# Patient Record
Sex: Female | Born: 1950 | Race: White | Hispanic: No | Marital: Married | Smoking: Former smoker
Health system: Southern US, Community
[De-identification: ages and names within clinical notes are randomized; demographics above are authoritative.]

## PROBLEM LIST (undated history)

## (undated) DIAGNOSIS — R0600 Dyspnea, unspecified: Secondary | ICD-10-CM

## (undated) DIAGNOSIS — E785 Hyperlipidemia, unspecified: Secondary | ICD-10-CM

## (undated) DIAGNOSIS — Z860100 Personal history of colon polyps, unspecified: Secondary | ICD-10-CM

## (undated) DIAGNOSIS — F17200 Nicotine dependence, unspecified, uncomplicated: Secondary | ICD-10-CM

## (undated) DIAGNOSIS — L578 Other skin changes due to chronic exposure to nonionizing radiation: Secondary | ICD-10-CM

## (undated) DIAGNOSIS — I10 Essential (primary) hypertension: Secondary | ICD-10-CM

## (undated) DIAGNOSIS — R011 Cardiac murmur, unspecified: Secondary | ICD-10-CM

## (undated) DIAGNOSIS — R079 Chest pain, unspecified: Principal | ICD-10-CM

## (undated) DIAGNOSIS — Z0000 Encounter for general adult medical examination without abnormal findings: Secondary | ICD-10-CM

## (undated) DIAGNOSIS — E875 Hyperkalemia: Secondary | ICD-10-CM

## (undated) DIAGNOSIS — Z87891 Personal history of nicotine dependence: Secondary | ICD-10-CM

## (undated) DIAGNOSIS — Z8619 Personal history of other infectious and parasitic diseases: Secondary | ICD-10-CM

## (undated) DIAGNOSIS — F4321 Adjustment disorder with depressed mood: Secondary | ICD-10-CM

## (undated) DIAGNOSIS — F640 Transsexualism: Secondary | ICD-10-CM

## (undated) DIAGNOSIS — S61419A Laceration without foreign body of unspecified hand, initial encounter: Secondary | ICD-10-CM

## (undated) DIAGNOSIS — Z8481 Family history of carrier of genetic disease: Secondary | ICD-10-CM

## (undated) HISTORY — DX: Chest pain, unspecified: R07.9

## (undated) HISTORY — DX: Hyperkalemia: E87.5

## (undated) HISTORY — PX: COLONOSCOPY: SHX174

## (undated) HISTORY — DX: Nicotine dependence, unspecified, uncomplicated: F17.200

## (undated) HISTORY — DX: Adjustment disorder with depressed mood: F43.21

## (undated) HISTORY — DX: Dyspnea, unspecified: R06.00

## (undated) HISTORY — DX: Laceration without foreign body of unspecified hand, initial encounter: S61.419A

## (undated) HISTORY — DX: Personal history of other infectious and parasitic diseases: Z86.19

## (undated) HISTORY — DX: Personal history of nicotine dependence: Z87.891

## (undated) HISTORY — DX: Other skin changes due to chronic exposure to nonionizing radiation: L57.8

## (undated) HISTORY — PX: POLYPECTOMY: SHX149

## (undated) HISTORY — DX: Transsexualism: F64.0

## (undated) HISTORY — DX: Personal history of colonic polyps: Z86.010

## (undated) HISTORY — DX: Cardiac murmur, unspecified: R01.1

## (undated) HISTORY — DX: Hyperlipidemia, unspecified: E78.5

## (undated) HISTORY — DX: Family history of carrier of genetic disease: Z84.81

## (undated) HISTORY — DX: Essential (primary) hypertension: I10

## (undated) HISTORY — DX: Personal history of colon polyps, unspecified: Z86.0100

## (undated) HISTORY — DX: Encounter for general adult medical examination without abnormal findings: Z00.00

---
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
## 2011-01-14 ENCOUNTER — Emergency Department (INDEPENDENT_AMBULATORY_CARE_PROVIDER_SITE_OTHER): Payer: 59

## 2011-01-14 ENCOUNTER — Emergency Department (HOSPITAL_BASED_OUTPATIENT_CLINIC_OR_DEPARTMENT_OTHER)
Admission: EM | Admit: 2011-01-14 | Discharge: 2011-01-15 | Disposition: A | Discharge status: Home / Self Care | Payer: 59 | Attending: Emergency Medicine | Admitting: Emergency Medicine

## 2011-01-14 DIAGNOSIS — Z79899 Other long term (current) drug therapy: Secondary | ICD-10-CM | POA: Insufficient documentation

## 2011-01-14 DIAGNOSIS — S060X0A Concussion without loss of consciousness, initial encounter: Secondary | ICD-10-CM | POA: Insufficient documentation

## 2011-01-14 DIAGNOSIS — R51 Headache: Secondary | ICD-10-CM

## 2011-01-14 DIAGNOSIS — E7800 Pure hypercholesterolemia, unspecified: Secondary | ICD-10-CM | POA: Insufficient documentation

## 2011-01-14 DIAGNOSIS — M542 Cervicalgia: Secondary | ICD-10-CM | POA: Insufficient documentation

## 2011-01-14 DIAGNOSIS — S0100XA Unspecified open wound of scalp, initial encounter: Secondary | ICD-10-CM | POA: Insufficient documentation

## 2011-01-14 DIAGNOSIS — W19XXXA Unspecified fall, initial encounter: Secondary | ICD-10-CM

## 2011-01-14 DIAGNOSIS — F17200 Nicotine dependence, unspecified, uncomplicated: Secondary | ICD-10-CM | POA: Insufficient documentation

## 2011-01-14 DIAGNOSIS — Z043 Encounter for examination and observation following other accident: Secondary | ICD-10-CM

## 2011-01-14 DIAGNOSIS — I10 Essential (primary) hypertension: Secondary | ICD-10-CM | POA: Insufficient documentation

## 2011-01-14 DIAGNOSIS — Y92009 Unspecified place in unspecified non-institutional (private) residence as the place of occurrence of the external cause: Secondary | ICD-10-CM | POA: Insufficient documentation

## 2012-01-09 ENCOUNTER — Encounter: Payer: Self-pay | Admitting: Internal Medicine

## 2012-01-09 ENCOUNTER — Ambulatory Visit (INDEPENDENT_AMBULATORY_CARE_PROVIDER_SITE_OTHER): Payer: 59 | Admitting: Internal Medicine

## 2012-01-09 ENCOUNTER — Ambulatory Visit: Payer: Self-pay | Admitting: Internal Medicine

## 2012-01-09 DIAGNOSIS — E785 Hyperlipidemia, unspecified: Secondary | ICD-10-CM

## 2012-01-09 DIAGNOSIS — I10 Essential (primary) hypertension: Secondary | ICD-10-CM

## 2012-01-09 DIAGNOSIS — K635 Polyp of colon: Secondary | ICD-10-CM

## 2012-01-09 DIAGNOSIS — D126 Benign neoplasm of colon, unspecified: Secondary | ICD-10-CM

## 2012-01-09 DIAGNOSIS — Z79899 Other long term (current) drug therapy: Secondary | ICD-10-CM

## 2012-01-09 DIAGNOSIS — E291 Testicular hypofunction: Secondary | ICD-10-CM

## 2012-01-09 LAB — BASIC METABOLIC PANEL
BUN: 17 mg/dL (ref 6–23)
CO2: 27 mEq/L (ref 19–32)
Calcium: 9.9 mg/dL (ref 8.4–10.5)
Glucose, Bld: 97 mg/dL (ref 70–99)

## 2012-01-09 LAB — CBC WITH DIFFERENTIAL/PLATELET
Basophils Relative: 0 % (ref 0–1)
Eosinophils Absolute: 0.2 10*3/uL (ref 0.0–0.7)
HCT: 46.9 % (ref 39.0–52.0)
Hemoglobin: 16.2 g/dL (ref 13.0–17.0)
Lymphs Abs: 2 10*3/uL (ref 0.7–4.0)
MCH: 33.5 pg (ref 26.0–34.0)
MCHC: 34.5 g/dL (ref 30.0–36.0)
MCV: 97.1 fL (ref 78.0–100.0)
Monocytes Absolute: 0.4 10*3/uL (ref 0.1–1.0)
Monocytes Relative: 5 % (ref 3–12)
RBC: 4.83 MIL/uL (ref 4.22–5.81)

## 2012-01-09 LAB — HEPATIC FUNCTION PANEL
ALT: 14 U/L (ref 0–53)
AST: 15 U/L (ref 0–37)
Bilirubin, Direct: 0.1 mg/dL (ref 0.0–0.3)
Indirect Bilirubin: 0.6 mg/dL (ref 0.0–0.9)
Total Bilirubin: 0.7 mg/dL (ref 0.3–1.2)

## 2012-01-09 LAB — LIPID PANEL
Cholesterol: 201 mg/dL — ABNORMAL HIGH (ref 0–200)
VLDL: 20 mg/dL (ref 0–40)

## 2012-01-09 MED ORDER — LISINOPRIL 20 MG PO TABS: 20.0000 mg | ORAL_TABLET | Freq: Every day | ORAL | Status: DC

## 2012-01-09 MED ORDER — VARDENAFIL HCL 10 MG PO TABS: 10.0000 mg | ORAL_TABLET | Freq: Every day | ORAL | Status: DC | PRN

## 2012-01-09 MED ORDER — ROSUVASTATIN CALCIUM 20 MG PO TABS: 20.0000 mg | ORAL_TABLET | Freq: Every day | ORAL | Status: DC

## 2012-01-09 NOTE — Patient Instructions (Signed)
Please schedule cbc, chem7, lipid, lft, tsh, ua with reflex, psa -v70.0 prior to next visit

## 2012-01-10 LAB — TESTOSTERONE: Testosterone: 358.27 ng/dL (ref 250–890)

## 2012-01-15 ENCOUNTER — Telehealth: Payer: Self-pay | Admitting: Internal Medicine

## 2012-01-15 MED ORDER — TADALAFIL 10 MG PO TABS: 10.0000 mg | ORAL_TABLET | Freq: Every day | ORAL | Status: DC | PRN

## 2012-01-15 NOTE — Telephone Encounter (Signed)
Could they switch the levitra to cialis or viagra as they are less expensive

## 2012-01-15 NOTE — Telephone Encounter (Signed)
Attempted to reach pt and received busy tone.

## 2012-01-15 NOTE — Telephone Encounter (Signed)
Can try cialis.  Rx sent.

## 2012-01-15 NOTE — Telephone Encounter (Signed)
Please advise 

## 2012-01-16 NOTE — Telephone Encounter (Signed)
Attempted to reach pt and received fast busy tone.  Encounter is being closed as Rx alternative was sent to pharmacy yesterday.

## 2012-01-18 DIAGNOSIS — K635 Polyp of colon: Secondary | ICD-10-CM | POA: Insufficient documentation

## 2012-01-18 DIAGNOSIS — I10 Essential (primary) hypertension: Secondary | ICD-10-CM | POA: Insufficient documentation

## 2012-01-18 DIAGNOSIS — E291 Testicular hypofunction: Secondary | ICD-10-CM | POA: Insufficient documentation

## 2012-01-18 DIAGNOSIS — E785 Hyperlipidemia, unspecified: Secondary | ICD-10-CM | POA: Insufficient documentation

## 2012-01-18 NOTE — Assessment & Plan Note (Signed)
Obtain testosterone. Attempt levitra.

## 2012-01-18 NOTE — Assessment & Plan Note (Signed)
Rf. Lisinopril. Maintain outpt bp log for review. Obtain cbc and chem7

## 2012-01-18 NOTE — Progress Notes (Signed)
  Subjective:    Patient ID: Taylor Newman, female    DOB: May 27, 1951, 61 y.o.   MRN: [id number]  HPI Pt presents to clinic to establish care. Has posterior neck ST mass c/w lipoma- asx without pain. Notes ED with intact libido. Continues to smoke tobacco but is not ready for cessation. Last colonoscopy 2010 with 3 year recommended follow up. Tolerates statin tx without myalgia or abn lft. No other complaints.  Past Medical History  Diagnosis Date  . History of chicken pox   . Heart murmur   . Hypertension   . Hyperlipidemia   . History of colon polyps    Past Surgical History  Procedure Date  . Tonsillectomy 1954    reports that he has been smoking Cigarettes.  He has never used smokeless tobacco. He reports that he drinks alcohol. He reports that he does not use illicit drugs. family history includes Breast cancer in his sister; Colon cancer in his father; Diabetes in his maternal grandmother; Heart disease in his father; and Hyperlipidemia in his father and mother.  There is no history of Prostate cancer. No Known Allergies   Review of Systems  Respiratory: Negative for shortness of breath.   Cardiovascular: Negative for chest pain.  Gastrointestinal: Negative for abdominal pain.  All other systems reviewed and are negative.       Objective:   Physical Exam  Nursing note and vitals reviewed. Constitutional: He appears well-developed and well-nourished. No distress.  HENT:  Head: Normocephalic and atraumatic.  Right Ear: External ear normal.  Left Ear: External ear normal.  Eyes: Conjunctivae are normal. Right eye exhibits no discharge. Left eye exhibits no discharge. No scleral icterus.  Neck: Neck supple.  Cardiovascular: Normal rate, regular rhythm and normal heart sounds.  Exam reveals no gallop and no friction rub.   No murmur heard. Pulmonary/Chest: Effort normal and breath sounds normal. No respiratory distress. He has no wheezes. He has no rales.  Lymphadenopathy:      He has no cervical adenopathy.  Neurological: He is alert.  Skin: Skin is warm and dry. He is not diaphoretic.  Psychiatric: He has a normal mood and affect.          Assessment & Plan:

## 2012-01-18 NOTE — Assessment & Plan Note (Signed)
Obtain lipid/lft. 

## 2012-01-18 NOTE — Assessment & Plan Note (Signed)
Schedule colonoscopy

## 2012-01-25 ENCOUNTER — Ambulatory Visit (AMBULATORY_SURGERY_CENTER): Payer: 59 | Admitting: *Deleted

## 2012-01-25 ENCOUNTER — Telehealth: Payer: Self-pay | Admitting: *Deleted

## 2012-01-25 VITALS — Ht 69.0 in | Wt 175.0 lb

## 2012-01-25 DIAGNOSIS — Z1211 Encounter for screening for malignant neoplasm of colon: Secondary | ICD-10-CM

## 2012-01-25 DIAGNOSIS — Z86010 Personal history of colonic polyps: Secondary | ICD-10-CM

## 2012-01-25 MED ORDER — PEG-KCL-NACL-NASULF-NA ASC-C 100 G PO SOLR: ORAL | Status: DC

## 2012-01-25 NOTE — Progress Notes (Signed)
Patient states has had colonoscopy in [place] in 2000 normal & 2005 with polyps removed. Last colonoscopy was at [facility] in 2010 with polyps removed and recommendations given to repeat colonoscopy in 3 years. Patient denies any GI problems or complaints. Patient signed release of information form and this was given to [facility]. Phone note sent her also.

## 2012-01-25 NOTE — Telephone Encounter (Signed)
Patient states had colonoscopy in [place] in 2000 normal & 2005 polyps removed. Last colonoscopy was with [facility] with polyps removed and recommended repeat in 3 years. Patient does not know Doctor's name. Release of information completed and signed by patient for [facility].

## 2012-02-11 ENCOUNTER — Encounter: Payer: Self-pay | Admitting: Internal Medicine

## 2012-02-11 ENCOUNTER — Ambulatory Visit (AMBULATORY_SURGERY_CENTER): Payer: 59 | Admitting: Internal Medicine

## 2012-02-11 DIAGNOSIS — Z1211 Encounter for screening for malignant neoplasm of colon: Secondary | ICD-10-CM

## 2012-02-11 DIAGNOSIS — Z860100 Personal history of colon polyps, unspecified: Secondary | ICD-10-CM

## 2012-02-11 DIAGNOSIS — K635 Polyp of colon: Secondary | ICD-10-CM

## 2012-02-11 DIAGNOSIS — D126 Benign neoplasm of colon, unspecified: Secondary | ICD-10-CM

## 2012-02-11 MED ORDER — CIPROFLOXACIN HCL 500 MG PO TABS: 500.0000 mg | ORAL_TABLET | Freq: Two times a day (BID) | ORAL | Status: AC

## 2012-02-11 MED ORDER — METRONIDAZOLE 500 MG PO TABS: 500.0000 mg | ORAL_TABLET | Freq: Three times a day (TID) | ORAL | Status: AC

## 2012-02-11 MED ORDER — SODIUM CHLORIDE 0.9 % IV SOLN: 500.0000 mL | INTRAVENOUS | Status: DC

## 2012-02-11 NOTE — Op Note (Signed)
[facility] [address] [place], [place]  [address]  COLONOSCOPY PROCEDURE REPORT  PATIENT:  Taylor Newman, Taylor Newman  MR#:  [id number] BIRTHDATE:  1951/09/05, 60 yrs. old  GENDER:  female ENDOSCOPIST:  Carie Caddy. Jakaila Norment, MD REF. BY:  Charlynn Court, M.D. PROCEDURE DATE:  02/11/2012 PROCEDURE:  Colon with cold biopsy polypectomy ASA CLASS:  Class II INDICATIONS:  surveillance and high-risk screening (history of adenomatous colon polyp) MEDICATIONS:   These medications were titrated to patient response per physician's verbal order, Versed 9 mg IV, Fentanyl 100 mcg IV  DESCRIPTION OF PROCEDURE:   After the risks benefits and alternatives of the procedure were thoroughly explained, informed consent was obtained.  Digital rectal exam was performed and revealed perianal skin tags.   The LB 180AL [id number] and LB PCF-H180AL [id number] endoscope was introduced through the anus and advanced to the cecum, which was identified by both the appendix and ileocecal valve, without limitations.  The quality of the prep was good, using MoviPrep.  The instrument was then slowly withdrawn as the colon was fully examined. <<PROCEDUREIMAGES>> FINDINGS:  A 4 mm sessile polyp was found in the transverse colon. The polyp was removed using cold biopsy forceps.  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  Moderate diverticulosis was found ascending colon to sigmoid colon.  A small lipoma was found in the transverse colon.   Retroflexed views in the rectum revealed no other findings other than those already described (skin tag).  The scope was then withdrawn from the cecum and the procedure completed.  COMPLICATIONS:  None  ENDOSCOPIC IMPRESSION: 1) Sessile polyp in the transverse colon.  Removed and sent to pathology. 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  RECOMMENDATIONS: 1) Hold aspirin, aspirin products, and anti-inflammatory medication for 1 week. 2) Await pathology results 3) High fiber diet. 4) Given your personal history of adenomatous (pre-cancerous) polyps, you will need a repeat colonoscopy in 5 years. 5) Given ulcerated diverticulum seen on today's examination, would recommend antibiotics for 7 days, ciprofloxacin 500 mg twice daily and metronidazole 500 mg three times daily.  Carie Caddy. Rhea Belton, MD  CC:  Charlynn Court MD The Patient  n. eSIGNEDCarie Caddy. Deontrae Drinkard at 02/11/2012 09:11 AM  [place], [phone]

## 2012-02-11 NOTE — Patient Instructions (Signed)
HOLD ASPIRIN AND ANTIINFLAMMATORY MEDICATIONS FOR ONE WEEK  YOU HAD AN ENDOSCOPIC PROCEDURE TODAY AT THE [facility]: Refer to the procedure report that was given to you for any specific questions about what was found during the examination.  If the procedure report does not answer your questions, please call your gastroenterologist to clarify.  If you requested that your care partner not be given the details of your procedure findings, then the procedure report has been included in a sealed envelope for you to review at your convenience later.  YOU SHOULD EXPECT: Some feelings of bloating in the abdomen. Passage of more gas than usual.  Walking can help get rid of the air that was put into your GI tract during the procedure and reduce the bloating. If you had a lower endoscopy (such as a colonoscopy or flexible sigmoidoscopy) you may notice spotting of blood in your stool or on the toilet paper. If you underwent a bowel prep for your procedure, then you may not have a normal bowel movement for a few days.  DIET: Your first meal following the procedure should be a light meal and then it is ok to progress to your normal diet.  A half-sandwich or bowl of soup is an example of a good first meal.  Heavy or fried foods are harder to digest and may make you feel nauseous or bloated.  Likewise meals heavy in dairy and vegetables can cause extra gas to form and this can also increase the bloating.  Drink plenty of fluids but you should avoid alcoholic beverages for 24 hours.  ACTIVITY: Your care partner should take you home directly after the procedure.  You should plan to take it easy, moving slowly for the rest of the day.  You can resume normal activity the day after the procedure however you should NOT DRIVE or use heavy machinery for 24 hours (because of the sedation medicines used during the test).    SYMPTOMS TO REPORT IMMEDIATELY: A gastroenterologist can be reached at any hour.  During  normal business hours, 8:30 AM to 5:00 PM Monday through Friday, call [phone].  After hours and on weekends, please call the GI answering service at [phone] who will take a message and have the physician on call contact you.   Following lower endoscopy (colonoscopy or flexible sigmoidoscopy):  Excessive amounts of blood in the stool  Significant tenderness or worsening of abdominal pains  Swelling of the abdomen that is new, acute  Fever of 100F or higher  FOLLOW UP: If any biopsies were taken you will be contacted by phone or by letter within the next 1-3 weeks.  Call your gastroenterologist if you have not heard about the biopsies in 3 weeks.  Our staff will call the home number listed on your records the next business day following your procedure to check on you and address any questions or concerns that you may have at that time regarding the information given to you following your procedure. This is a courtesy call and so if there is no answer at the home number and we have not heard from you through the emergency physician on call, we will assume that you have returned to your regular daily activities without incident.  SIGNATURES/CONFIDENTIALITY: You and/or your care partner have signed paperwork which will be entered into your electronic medical record.  These signatures attest to the fact that that the information above on your After Visit Summary has been reviewed and is   understood.  Full responsibility of the confidentiality of this discharge information lies with you and/or your care-partner.  

## 2012-02-11 NOTE — Progress Notes (Signed)
Patient did not experience any of the following events: a burn prior to discharge; a fall within the facility; wrong site/side/patient/procedure/implant event; or a hospital transfer or hospital admission upon discharge from the facility. (G8907) Patient did not have preoperative order for IV antibiotic SSI prophylaxis. (G8918)  

## 2012-02-12 ENCOUNTER — Telehealth: Payer: Self-pay | Admitting: *Deleted

## 2012-02-12 NOTE — Telephone Encounter (Signed)
  Follow up Call-  Call back number 02/11/2012  Post procedure Call Back phone  # [phone]  Permission to leave phone message Yes     Patient questions:  Do you have a fever, pain , or abdominal swelling? no Pain Score  0 *  Have you tolerated food without any problems? yes  Have you been able to return to your normal activities? yes  Do you have any questions about your discharge instructions: Diet   no Medications  no Follow up visit  no  Do you have questions or concerns about your Care? no  Actions: * If pain score is 4 or above: No action needed, pain <4.

## 2012-02-15 ENCOUNTER — Encounter: Payer: Self-pay | Admitting: Internal Medicine

## 2012-07-07 ENCOUNTER — Encounter: Payer: 59 | Admitting: Internal Medicine

## 2012-07-11 ENCOUNTER — Telehealth: Payer: Self-pay | Admitting: *Deleted

## 2012-07-11 DIAGNOSIS — Z0000 Encounter for general adult medical examination without abnormal findings: Secondary | ICD-10-CM

## 2012-07-11 LAB — LIPID PANEL
Cholesterol: 181 mg/dL (ref 0–200)
Triglycerides: 73 mg/dL (ref <150)
VLDL: 15 mg/dL (ref 0–40)

## 2012-07-11 LAB — CBC WITH DIFFERENTIAL/PLATELET
Basophils Absolute: 0 10*3/uL (ref 0.0–0.1)
Basophils Relative: 0 % (ref 0–1)
Eosinophils Absolute: 0.1 10*3/uL (ref 0.0–0.7)
Eosinophils Relative: 1 % (ref 0–5)
HCT: 45.6 % (ref 39.0–52.0)
Hemoglobin: 15.9 g/dL (ref 13.0–17.0)
MCH: 33.2 pg (ref 26.0–34.0)
MCHC: 34.9 g/dL (ref 30.0–36.0)
Monocytes Absolute: 0.3 10*3/uL (ref 0.1–1.0)
Monocytes Relative: 4 % (ref 3–12)
Neutro Abs: 5.7 10*3/uL (ref 1.7–7.7)
RDW: 13.3 % (ref 11.5–15.5)

## 2012-07-11 LAB — BASIC METABOLIC PANEL
CO2: 24 mEq/L (ref 19–32)
Calcium: 9.6 mg/dL (ref 8.4–10.5)
Chloride: 105 mEq/L (ref 96–112)
Glucose, Bld: 108 mg/dL — ABNORMAL HIGH (ref 70–99)
Sodium: 140 mEq/L (ref 135–145)

## 2012-07-11 LAB — HEPATIC FUNCTION PANEL
ALT: 16 U/L (ref 0–53)
AST: 16 U/L (ref 0–37)
Albumin: 4.4 g/dL (ref 3.5–5.2)
Total Protein: 6.2 g/dL (ref 6.0–8.3)

## 2012-07-11 LAB — PSA: PSA: 0.26 ng/mL (ref <=4.00)

## 2012-07-11 NOTE — Telephone Encounter (Signed)
Pt presented to the lab. Orders placed per previous office note:  Please schedule cbc, chem7, lipid, lft, tsh, ua with reflex, psa -v70.0 prior to next visit

## 2012-07-12 LAB — URINALYSIS, ROUTINE W REFLEX MICROSCOPIC
Bilirubin Urine: NEGATIVE
Glucose, UA: NEGATIVE mg/dL
Leukocytes, UA: NEGATIVE
Protein, ur: NEGATIVE mg/dL
Specific Gravity, Urine: 1.005 (ref 1.005–1.030)
pH: 6.5 (ref 5.0–8.0)

## 2012-07-14 ENCOUNTER — Ambulatory Visit (INDEPENDENT_AMBULATORY_CARE_PROVIDER_SITE_OTHER): Payer: 59 | Admitting: Internal Medicine

## 2012-07-14 ENCOUNTER — Encounter: Payer: 59 | Admitting: Internal Medicine

## 2012-07-14 ENCOUNTER — Encounter: Payer: Self-pay | Admitting: Internal Medicine

## 2012-07-14 VITALS — BP 124/84 | HR 59 | Temp 98.3°F | Resp 16 | Ht 69.0 in | Wt 170.0 lb

## 2012-07-14 DIAGNOSIS — R7309 Other abnormal glucose: Secondary | ICD-10-CM

## 2012-07-14 DIAGNOSIS — Z0000 Encounter for general adult medical examination without abnormal findings: Secondary | ICD-10-CM

## 2012-07-14 DIAGNOSIS — R739 Hyperglycemia, unspecified: Secondary | ICD-10-CM

## 2012-07-14 DIAGNOSIS — Z136 Encounter for screening for cardiovascular disorders: Secondary | ICD-10-CM

## 2012-07-14 NOTE — Progress Notes (Signed)
  Subjective:    Patient ID: Taylor Newman, female    DOB: 08-15-1951, 61 y.o.   MRN: [id number]  HPI Pt presents to clinic for annual exam. Reviewed mildly elevated fasting glucose. Continues to smoke tobacco. Aware of need for cessation but feels the timing is not ideal given family stressors.   Past Medical History  Diagnosis Date  . History of chicken pox   . Heart murmur   . Hypertension   . Hyperlipidemia   . History of colon polyps    Past Surgical History  Procedure Date  . Tonsillectomy 1954  . Colonoscopy     reports that he has been smoking Cigarettes.  He has a 45 pack-year smoking history. He has never used smokeless tobacco. He reports that he drinks about 4.8 ounces of alcohol per week. He reports that he does not use illicit drugs. family history includes Breast cancer in his sister; Colon cancer (age of onset:74) in his father; Diabetes in his maternal grandmother; Heart disease in his father; and Hyperlipidemia in his father and mother.  There is no history of Prostate cancer. No Known Allergies   Review of Systems see hpi     Objective:   Physical Exam  Nursing note and vitals reviewed. Constitutional: He appears well-developed. No distress.  HENT:  Head: Normocephalic and atraumatic.  Right Ear: External ear normal.  Left Ear: External ear normal.  Nose: Nose normal.  Mouth/Throat: Oropharynx is clear and moist. No oropharyngeal exudate.  Eyes: Conjunctivae and EOM are normal. Pupils are equal, round, and reactive to light. No scleral icterus.  Neck: Neck supple. Carotid bruit is not present. No thyromegaly present.  Cardiovascular: Normal rate, regular rhythm and normal heart sounds.  Exam reveals no gallop and no friction rub.   No murmur heard. Pulmonary/Chest: Effort normal and breath sounds normal. No respiratory distress. He has no wheezes. He has no rales.  Abdominal: Soft. Bowel sounds are normal. He exhibits no distension and no mass. There is no  tenderness. There is no rebound and no guarding.  Lymphadenopathy:    He has no cervical adenopathy.  Neurological: He is alert.  Skin: Skin is warm and dry. He is not diaphoretic.  Psychiatric: He has a normal mood and affect.          Assessment & Plan:

## 2012-07-14 NOTE — Patient Instructions (Signed)
Please schedule fasting labs prior to next visit Chem7, a1c-hyperglycemia and lipid/lft-272.4 

## 2012-07-14 NOTE — Assessment & Plan Note (Signed)
Nl exam. Labs reviewed. Low sugar/carb diet and exercise recommended. EKG obtained demonstrates SB 50 with PAC. Nl intervals and axis.

## 2012-09-16 ENCOUNTER — Other Ambulatory Visit: Payer: Self-pay | Admitting: Internal Medicine

## 2012-12-25 ENCOUNTER — Encounter: Payer: Self-pay | Admitting: Family Medicine

## 2012-12-25 ENCOUNTER — Ambulatory Visit (INDEPENDENT_AMBULATORY_CARE_PROVIDER_SITE_OTHER): Payer: 59 | Admitting: Family Medicine

## 2012-12-25 VITALS — BP 126/76 | HR 56 | Temp 97.4°F | Ht 66.0 in | Wt 170.2 lb

## 2012-12-25 DIAGNOSIS — H60549 Acute eczematoid otitis externa, unspecified ear: Secondary | ICD-10-CM

## 2012-12-25 DIAGNOSIS — H60399 Other infective otitis externa, unspecified ear: Secondary | ICD-10-CM

## 2012-12-25 DIAGNOSIS — H60509 Unspecified acute noninfective otitis externa, unspecified ear: Secondary | ICD-10-CM

## 2012-12-25 DIAGNOSIS — H6010 Cellulitis of external ear, unspecified ear: Secondary | ICD-10-CM | POA: Insufficient documentation

## 2012-12-25 MED ORDER — FLUOCINOLONE ACETONIDE 0.01 % OT OIL: 5.0000 [drp] | TOPICAL_OIL | Freq: Two times a day (BID) | OTIC | Status: DC

## 2012-12-25 MED ORDER — CEPHALEXIN 500 MG PO CAPS: 500.0000 mg | ORAL_CAPSULE | Freq: Two times a day (BID) | ORAL | Status: DC

## 2012-12-25 MED ORDER — CEPHALEXIN 500 MG PO CAPS: 500.0000 mg | ORAL_CAPSULE | Freq: Two times a day (BID) | ORAL | Status: AC

## 2012-12-25 MED ORDER — NEOMYCIN-POLYMYXIN-HC 3.5-10000-1 OT SUSP: 4.0000 [drp] | Freq: Three times a day (TID) | OTIC | Status: DC

## 2012-12-25 NOTE — Progress Notes (Signed)
  Subjective:    Patient ID: Taylor Newman, female    DOB: 1951-06-08, 62 y.o.   MRN: [id number]  HPI R ear pain- described as a throbbing, 'very tender to touch'.  sxs started early in the week w/ nasal congestion- took benadryl w/out relief.  Ear is now red and swollen- denies warmth.  Is having drainage from ear when lying on L side.  Drainage is clear.  No documented fevers.  Congestion is improving.  No cough.  W/ exception of ear, otherwise feeling well.  Pt has hx of dry skin in ears and will frequently itch.   Review of Systems For ROS see HPI     Objective:   Physical Exam  Vitals reviewed. Constitutional: He appears well-developed and well-nourished. No distress.  HENT:  Right Ear: There is drainage (clear to pearly white debris in EAC), swelling (erythema and edema of pinna) and tenderness (over pinna and w/ manipulation of tragus).  Left Ear: Tympanic membrane normal. No drainage or swelling.  Nose: Nose normal.  Mouth/Throat: Oropharynx is clear and moist. No oropharyngeal exudate.       No TTP over sinuses          Assessment & Plan:

## 2012-12-25 NOTE — Patient Instructions (Signed)
We're going to treat this w/ both drops and pills Start the Keflex twice daily- take w/ food Use the Cortisporin ear drops for 7 days Once the infection is cleared- start the dermotic drops twice daily for 2 weeks and then as needed Call with any questions or concerns Hang in there!!

## 2012-12-29 NOTE — Assessment & Plan Note (Signed)
New.  Pt w/ chronic ear eczema which was likely the nidus for current infxn.  Start tx w/ both oral and topical abx.  Pt also given script for DermOtic to improve eczema sxs once infxn resolves.  Reviewed supportive care and red flags that should prompt return.  Pt expressed understanding and is in agreement w/ plan.

## 2012-12-29 NOTE — Assessment & Plan Note (Signed)
New.  Start keflex.  Reviewed supportive care and red flags that should prompt return.  Pt expressed understanding and is in agreement w/ plan.

## 2013-01-08 ENCOUNTER — Telehealth: Payer: Self-pay | Admitting: *Deleted

## 2013-01-08 DIAGNOSIS — E785 Hyperlipidemia, unspecified: Secondary | ICD-10-CM

## 2013-01-08 DIAGNOSIS — R7309 Other abnormal glucose: Secondary | ICD-10-CM

## 2013-01-08 LAB — LIPID PANEL
LDL Cholesterol: 96 mg/dL (ref 0–99)
Triglycerides: 144 mg/dL (ref <150)
VLDL: 29 mg/dL (ref 0–40)

## 2013-01-08 LAB — BASIC METABOLIC PANEL
BUN: 14 mg/dL (ref 6–23)
CO2: 27 mEq/L (ref 19–32)
Glucose, Bld: 76 mg/dL (ref 70–99)
Potassium: 4.4 mEq/L (ref 3.5–5.3)

## 2013-01-08 LAB — HEPATIC FUNCTION PANEL
ALT: 12 U/L (ref 0–53)
AST: 15 U/L (ref 0–37)
Bilirubin, Direct: 0.1 mg/dL (ref 0.0–0.3)
Indirect Bilirubin: 0.3 mg/dL (ref 0.0–0.9)
Total Protein: 6.4 g/dL (ref 6.0–8.3)

## 2013-01-08 LAB — HEMOGLOBIN A1C: Hgb A1c MFr Bld: 5.6 % (ref <5.7)

## 2013-01-08 NOTE — Telephone Encounter (Signed)
Pt presented to the lab. Orders given per 07/14/12 office as below:  Please schedule fasting labs prior to next visit  Chem7, a1c-hyperglycemia and lipid/lft-272.4

## 2013-01-12 ENCOUNTER — Ambulatory Visit (INDEPENDENT_AMBULATORY_CARE_PROVIDER_SITE_OTHER): Payer: 59 | Admitting: Family Medicine

## 2013-01-12 ENCOUNTER — Encounter: Payer: Self-pay | Admitting: Family Medicine

## 2013-01-12 VITALS — BP 134/72 | HR 60 | Temp 98.2°F | Ht 69.0 in | Wt 170.4 lb

## 2013-01-12 DIAGNOSIS — Z0000 Encounter for general adult medical examination without abnormal findings: Secondary | ICD-10-CM

## 2013-01-12 DIAGNOSIS — R739 Hyperglycemia, unspecified: Secondary | ICD-10-CM

## 2013-01-12 DIAGNOSIS — R7309 Other abnormal glucose: Secondary | ICD-10-CM

## 2013-01-12 DIAGNOSIS — I10 Essential (primary) hypertension: Secondary | ICD-10-CM

## 2013-01-12 DIAGNOSIS — R079 Chest pain, unspecified: Secondary | ICD-10-CM

## 2013-01-12 DIAGNOSIS — Z125 Encounter for screening for malignant neoplasm of prostate: Secondary | ICD-10-CM

## 2013-01-12 DIAGNOSIS — E785 Hyperlipidemia, unspecified: Secondary | ICD-10-CM

## 2013-01-12 DIAGNOSIS — Z79899 Other long term (current) drug therapy: Secondary | ICD-10-CM

## 2013-01-12 HISTORY — DX: Chest pain, unspecified: R07.9

## 2013-01-12 NOTE — Assessment & Plan Note (Signed)
hgba1c 5.6, minimize simple carbs. 

## 2013-01-12 NOTE — Assessment & Plan Note (Addendum)
Well controlled today no changes 

## 2013-01-12 NOTE — Assessment & Plan Note (Signed)
Mild on previous labs resolved with recent lab work. Encouraged avoid trans fats and heart healthy diet.

## 2013-01-12 NOTE — Assessment & Plan Note (Signed)
Atypical and so far work up is negative for a GI or CV cause. Started on Ranitidine 300 mg qhs, check a 2d echo. Return if symptoms worsen or seek immediate care if symptoms worsen and do not resolve

## 2013-01-12 NOTE — Patient Instructions (Addendum)
Next appt in 6-7 months for annual, labs prior lipid, renal, cbc, tsh, psa, hepatic prior

## 2013-01-12 NOTE — Progress Notes (Signed)
Patient ID: Devaney Segers, female   DOB: 1951-08-17, 62 y.o.   MRN: [id number] Cendy Oconnor [id number] 1950-12-11 01/12/2013      Progress Note-Follow Up  Subjective  Chief Complaint  Chief Complaint  Patient presents with  . Follow-up    6 month    HPI  Patient is a 62 year old female in today complaining of persistent chest pain. He has chest pain. He constantly. It is more of a pressure and throughout his chest. It can be worse when he lies down. Denies any sour taste in the throat or sore throat but does have some occasional sense of dyspepsia. No shortness of breath, diaphoresis or nausea associated. No palpitations. So far cardiovascular and gastrointestinal workup has been unremarkable. No other acute complaints. Does note an occasional cough  Past Medical History  Diagnosis Date  . History of chicken pox   . Heart murmur   . Hypertension   . Hyperlipidemia   . History of colon polyps   . Chest pain 01/12/2013    Past Surgical History  Procedure Date  . Tonsillectomy 1954  . Colonoscopy     Family History  Problem Relation Age of Onset  . Colon cancer Father 103  . Hyperlipidemia Father   . Heart disease Father     stent placement  . Breast cancer Sister   . Hyperlipidemia Mother   . Diabetes Maternal Grandmother     father  . Prostate cancer Neg Hx     History   Social History  . Marital Status: Married    Spouse Name: N/A    Number of Children: N/A  . Years of Education: N/A   Occupational History  . Not on file.   Social History Main Topics  . Smoking status: Current Every Day Smoker -- 1.0 packs/day for 45 years    Types: Cigarettes  . Smokeless tobacco: Never Used     Comment: 1 ppd  . Alcohol Use: 4.8 oz/week    8 Cans of beer per week  . Drug Use: No  . Sexually Active: Not on file   Other Topics Concern  . Not on file   Social History Narrative  . No narrative on file    Current Outpatient Prescriptions on File Prior to Visit   Medication Sig Dispense Refill  . aspirin 81 MG chewable tablet Chew 81 mg by mouth daily.      . CRESTOR 20 MG tablet TAKE 1 TABLET BY MOUTH ONCE DAILY  30 tablet  6  . lisinopril (PRINIVIL,ZESTRIL) 20 MG tablet TAKE 1 TABLET BY MOUTH ONCE DAILY  30 tablet  6  . neomycin-polymyxin-hydrocortisone (CORTISPORIN) 3.5-10000-1 otic suspension Place 4 drops into the right ear 3 (three) times daily.  10 mL  0  . tadalafil (CIALIS) 10 MG tablet Take 1 tablet (10 mg total) by mouth daily as needed for erectile dysfunction.  10 tablet  0    No Known Allergies  Review of Systems  Review of Systems  Constitutional: Negative for fever and malaise/fatigue.  HENT: Negative for congestion.   Eyes: Negative for discharge.  Respiratory: Negative for shortness of breath.   Cardiovascular: Positive for chest pain. Negative for palpitations and leg swelling.  Gastrointestinal: Positive for heartburn. Negative for nausea, abdominal pain and diarrhea.  Genitourinary: Negative for dysuria.  Musculoskeletal: Negative for falls.  Skin: Negative for rash.  Neurological: Negative for loss of consciousness and headaches.  Endo/Heme/Allergies: Negative for polydipsia.  Psychiatric/Behavioral: Negative for depression and suicidal ideas.  The patient is not nervous/anxious and does not have insomnia.     Objective  BP 134/72  Pulse 60  Temp 98.2 F (36.8 C) (Oral)  Ht 5\' 9"  (1.753 m)  Wt 170 lb 6.4 oz (77.293 kg)  BMI 25.16 kg/m2  SpO2 98%  Physical Exam  Physical Exam  Constitutional: He is oriented to person, place, and time and well-developed, well-nourished, and in no distress. No distress.  HENT:  Head: Normocephalic and atraumatic.  Eyes: Conjunctivae normal are normal.  Neck: Neck supple. No thyromegaly present.  Cardiovascular: Normal rate and regular rhythm.  Exam reveals no gallop.   No murmur heard. Pulmonary/Chest: Effort normal and breath sounds normal. No respiratory distress.   Abdominal: He exhibits no distension and no mass. There is no tenderness.  Musculoskeletal: He exhibits no edema.  Neurological: He is alert and oriented to person, place, and time.  Skin: Skin is warm.  Psychiatric: Memory, affect and judgment normal.    Lab Results  Component Value Date   TSH 1.330 07/11/2012   Lab Results  Component Value Date   WBC 7.7 07/11/2012   HGB 15.9 07/11/2012   HCT 45.6 07/11/2012   MCV 95.2 07/11/2012   PLT 255 07/11/2012   Lab Results  Component Value Date   CREATININE 0.90 01/08/2013   BUN 14 01/08/2013   NA 141 01/08/2013   K 4.4 01/08/2013   CL 107 01/08/2013   CO2 27 01/08/2013   Lab Results  Component Value Date   ALT 12 01/08/2013   AST 15 01/08/2013   ALKPHOS 64 01/08/2013   BILITOT 0.4 01/08/2013   Lab Results  Component Value Date   CHOL 176 01/08/2013   Lab Results  Component Value Date   HDL 51 01/08/2013   Lab Results  Component Value Date   LDLCALC 96 01/08/2013   Lab Results  Component Value Date   TRIG 144 01/08/2013   Lab Results  Component Value Date   CHOLHDL 3.5 01/08/2013     Assessment & Plan  Chest pain Atypical and so far work up is negative for a GI or CV cause. Started on Ranitidine 300 mg qhs, check a 2d echo. Return if symptoms worsen or seek immediate care if symptoms worsen and do not resolve  HTN (hypertension) Mild elevation today will continue to monitor, continue current meds for now.  Hyperglycemia hgba1c 5.6, minimize simple carbs.  Other and unspecified hyperlipidemia Mild on previous labs resolved with recent lab work. Encouraged avoid trans fats and heart healthy diet.

## 2013-01-13 NOTE — Progress Notes (Signed)
Patient ID: Taylor Newman, female   DOB: [birth year], 62 y.o.   MRN: [id number] Note above entered in error, note below reflects visit  Taylor Newman [id number] September 10, 1951 01/13/2013      Progress Note-Follow Up  Subjective  Chief Complaint  Chief Complaint  Patient presents with  . Follow-up    6 month    HPI  Patient is a 62 year old female in today for followup. Overall he is feeling well. No recent illness or fevers. Has some occasional dyspepsia. Blood pressures been well-controlled. Denies any headaches, palpitations, chest pain, shortness of breath, GU concerns since last visit.  Past Medical History  Diagnosis Date  . History of chicken pox   . Heart murmur   . Hypertension   . Hyperlipidemia   . History of colon polyps   . Chest pain 01/12/2013    Past Surgical History  Procedure Date  . Tonsillectomy 1954  . Colonoscopy     Family History  Problem Relation Age of Onset  . Colon cancer Father 52  . Hyperlipidemia Father   . Heart disease Father     stent placement  . Breast cancer Sister   . Hyperlipidemia Mother   . Diabetes Maternal Grandmother     father  . Prostate cancer Neg Hx     History   Social History  . Marital Status: Married    Spouse Name: N/A    Number of Children: N/A  . Years of Education: N/A   Occupational History  . Not on file.   Social History Main Topics  . Smoking status: Current Every Day Smoker -- 1.0 packs/day for 45 years    Types: Cigarettes  . Smokeless tobacco: Never Used     Comment: 1 ppd  . Alcohol Use: 4.8 oz/week    8 Cans of beer per week  . Drug Use: No  . Sexually Active: Not on file   Other Topics Concern  . Not on file   Social History Narrative  . No narrative on file    Current Outpatient Prescriptions on File Prior to Visit  Medication Sig Dispense Refill  . aspirin 81 MG chewable tablet Chew 81 mg by mouth daily.      . CRESTOR 20 MG tablet TAKE 1 TABLET BY MOUTH ONCE DAILY  30 tablet  6  .  lisinopril (PRINIVIL,ZESTRIL) 20 MG tablet TAKE 1 TABLET BY MOUTH ONCE DAILY  30 tablet  6  . neomycin-polymyxin-hydrocortisone (CORTISPORIN) 3.5-10000-1 otic suspension Place 4 drops into the right ear 3 (three) times daily.  10 mL  0  . tadalafil (CIALIS) 10 MG tablet Take 1 tablet (10 mg total) by mouth daily as needed for erectile dysfunction.  10 tablet  0    No Known Allergies  Review of Systems  Review of Systems  Constitutional: Negative for fever and malaise/fatigue.  HENT: Negative for congestion.   Eyes: Negative for discharge.  Respiratory: Negative for shortness of breath.   Cardiovascular: Negative for chest pain, palpitations and leg swelling.  Gastrointestinal: Negative for nausea, abdominal pain and diarrhea.  Genitourinary: Negative for dysuria.  Musculoskeletal: Negative for falls.  Skin: Negative for rash.  Neurological: Negative for loss of consciousness and headaches.  Endo/Heme/Allergies: Negative for polydipsia.  Psychiatric/Behavioral: Negative for depression and suicidal ideas. The patient is not nervous/anxious and does not have insomnia.     Objective  BP 134/72  Pulse 60  Temp 98.2 F (36.8 C) (Oral)  Ht 5\' 9"  (1.753 m)  Wt  170 lb 6.4 oz (77.293 kg)  BMI 25.16 kg/m2  SpO2 98%  Physical Exam  Physical Exam  Lab Results  Component Value Date   TSH 1.330 07/11/2012   Lab Results  Component Value Date   WBC 7.7 07/11/2012   HGB 15.9 07/11/2012   HCT 45.6 07/11/2012   MCV 95.2 07/11/2012   PLT 255 07/11/2012   Lab Results  Component Value Date   CREATININE 0.90 01/08/2013   BUN 14 01/08/2013   NA 141 01/08/2013   K 4.4 01/08/2013   CL 107 01/08/2013   CO2 27 01/08/2013   Lab Results  Component Value Date   ALT 12 01/08/2013   AST 15 01/08/2013   ALKPHOS 64 01/08/2013   BILITOT 0.4 01/08/2013   Lab Results  Component Value Date   CHOL 176 01/08/2013   Lab Results  Component Value Date   HDL 51 01/08/2013   Lab Results  Component Value Date    LDLCALC 96 01/08/2013   Lab Results  Component Value Date   TRIG 144 01/08/2013   Lab Results  Component Value Date   CHOLHDL 3.5 01/08/2013     Assessment & Plan  Chest pain Atypical and so far work up is negative for a GI or CV cause. Started on Ranitidine 300 mg qhs, check a 2d echo. Return if symptoms worsen or seek immediate care if symptoms worsen and do not resolve  HTN (hypertension) Well controlled today no changes  Hyperglycemia hgba1c 5.6, minimize simple carbs.  Other and unspecified hyperlipidemia Mild on previous labs resolved with recent lab work. Encouraged avoid trans fats and heart healthy diet.

## 2013-01-13 NOTE — Progress Notes (Signed)
Patient ID: Taylor Newman, female   DOB: 14-Sep-1951, 62 y.o.   MRN: [id number] Above note done in error in wrong chart  Note below reflects patient visit

## 2013-01-24 ENCOUNTER — Other Ambulatory Visit: Payer: Self-pay

## 2013-02-09 ENCOUNTER — Telehealth: Payer: Self-pay

## 2013-02-09 NOTE — Telephone Encounter (Signed)
What strength of Atorvastatin?

## 2013-02-09 NOTE — Telephone Encounter (Signed)
We received a fax from [facility] stating that Crestor is changing to $25 a month.   The following medications will be $0:  Simvastatin, atorvatatin, pravastatin and lovastatin. Please advise change?

## 2013-02-09 NOTE — Telephone Encounter (Signed)
Start with Atorvastatin 20 mg po qhs, disp #30 3 rf.

## 2013-02-09 NOTE — Telephone Encounter (Signed)
So check with patient and see if he wants to stick with Crestor and pay copay or try switching to Atorvastatin, we would want to recheck his cholesterol in 3-4 months if we switch

## 2013-02-09 NOTE — Telephone Encounter (Signed)
Pt informed and new RX sent

## 2013-02-10 MED ORDER — ATORVASTATIN CALCIUM 20 MG PO TABS: 20.0000 mg | ORAL_TABLET | Freq: Every day | ORAL | Status: DC

## 2013-06-16 ENCOUNTER — Other Ambulatory Visit: Payer: Self-pay | Admitting: Internal Medicine

## 2013-06-17 NOTE — Telephone Encounter (Signed)
Rx request to pharmacy/[facility]  

## 2013-07-06 LAB — PSA: PSA: 0.38 ng/mL (ref <=4.00)

## 2013-07-06 LAB — BASIC METABOLIC PANEL
CO2: 25 mEq/L (ref 19–32)
Calcium: 9.6 mg/dL (ref 8.4–10.5)
Chloride: 105 mEq/L (ref 96–112)

## 2013-07-06 LAB — LIPID PANEL
HDL: 57 mg/dL (ref >39)
LDL Cholesterol: 123 mg/dL — ABNORMAL HIGH (ref 0–99)
Triglycerides: 103 mg/dL (ref <150)
VLDL: 21 mg/dL (ref 0–40)

## 2013-07-06 LAB — URINALYSIS, ROUTINE W REFLEX MICROSCOPIC
Leukocytes, UA: NEGATIVE
Nitrite: NEGATIVE
Specific Gravity, Urine: 1.011 (ref 1.005–1.030)
Urobilinogen, UA: 0.2 mg/dL (ref 0.0–1.0)

## 2013-07-06 LAB — CBC WITH DIFFERENTIAL/PLATELET
Eosinophils Relative: 1 % (ref 0–5)
HCT: 44.3 % (ref 39.0–52.0)
Hemoglobin: 16.1 g/dL (ref 13.0–17.0)
Lymphocytes Relative: 17 % (ref 12–46)
MCHC: 36.3 g/dL — ABNORMAL HIGH (ref 30.0–36.0)
MCV: 94.1 fL (ref 78.0–100.0)
Monocytes Absolute: 0.5 10*3/uL (ref 0.1–1.0)
Monocytes Relative: 6 % (ref 3–12)
Neutro Abs: 6.1 10*3/uL (ref 1.7–7.7)
WBC: 8 10*3/uL (ref 4.0–10.5)

## 2013-07-06 LAB — URINALYSIS, MICROSCOPIC ONLY

## 2013-07-06 LAB — TSH: TSH: 1.565 u[IU]/mL (ref 0.350–4.500)

## 2013-07-06 LAB — HEPATIC FUNCTION PANEL
Albumin: 4.3 g/dL (ref 3.5–5.2)
Total Protein: 6.4 g/dL (ref 6.0–8.3)

## 2013-07-06 NOTE — Addendum Note (Signed)
Addended by: Regis Bill on: 07/06/2013 10:57 AM   Modules accepted: Orders

## 2013-07-13 ENCOUNTER — Encounter: Payer: Self-pay | Admitting: Family Medicine

## 2013-07-13 ENCOUNTER — Ambulatory Visit (INDEPENDENT_AMBULATORY_CARE_PROVIDER_SITE_OTHER): Payer: 59 | Admitting: Family Medicine

## 2013-07-13 VITALS — BP 128/90 | HR 72 | Temp 98.3°F | Ht 69.0 in | Wt 163.1 lb

## 2013-07-13 DIAGNOSIS — F4321 Adjustment disorder with depressed mood: Secondary | ICD-10-CM

## 2013-07-13 DIAGNOSIS — E785 Hyperlipidemia, unspecified: Secondary | ICD-10-CM

## 2013-07-13 DIAGNOSIS — R7309 Other abnormal glucose: Secondary | ICD-10-CM

## 2013-07-13 DIAGNOSIS — R739 Hyperglycemia, unspecified: Secondary | ICD-10-CM

## 2013-07-13 DIAGNOSIS — I10 Essential (primary) hypertension: Secondary | ICD-10-CM

## 2013-07-13 NOTE — Patient Instructions (Addendum)
Next visit annual with labs prior lipid, renal, tsh, hepatic, cbc  DASH Diet The DASH diet stands for "Dietary Approaches to Stop Hypertension." It is a healthy eating plan that has been shown to reduce high blood pressure (hypertension) in as little as 14 days, while also possibly providing other significant health benefits. These other health benefits include reducing the risk of breast cancer after menopause and reducing the risk of type 2 diabetes, heart disease, colon cancer, and stroke. Health benefits also include weight loss and slowing kidney failure in patients with chronic kidney disease.  DIET GUIDELINES  Limit salt (sodium). Your diet should contain less than 1500 mg of sodium daily.  Limit refined or processed carbohydrates. Your diet should include mostly whole grains. Desserts and added sugars should be used sparingly.  Include small amounts of heart-healthy fats. These types of fats include nuts, oils, and tub margarine. Limit saturated and trans fats. These fats have been shown to be harmful in the body. CHOOSING FOODS  The following food groups are based on a 2000 calorie diet. See your Registered Dietitian for individual calorie needs. Grains and Grain Products (6 to 8 servings daily)  Eat More Often: Whole-wheat bread, brown rice, whole-grain or wheat pasta, quinoa, popcorn without added fat or salt (air popped).  Eat Less Often: White bread, white pasta, white rice, cornbread. Vegetables (4 to 5 servings daily)  Eat More Often: Fresh, frozen, and canned vegetables. Vegetables may be raw, steamed, roasted, or grilled with a minimal amount of fat.  Eat Less Often/Avoid: Creamed or fried vegetables. Vegetables in a cheese sauce. Fruit (4 to 5 servings daily)  Eat More Often: All fresh, canned (in natural juice), or frozen fruits. Dried fruits without added sugar. One hundred percent fruit juice ( cup [237 mL] daily).  Eat Less Often: Dried fruits with added sugar.  Canned fruit in light or heavy syrup. [employer], Fish, and Poultry (2 servings or less daily. One serving is 3 to 4 oz [85-114 g]).  Eat More Often: Ninety percent or leaner ground beef, tenderloin, sirloin. Round cuts of beef, chicken breast, [place] breast. All fish. Grill, bake, or broil your meat. Nothing should be fried.  Eat Less Often/Avoid: Fatty cuts of meat, [place], or chicken leg, thigh, or wing. Fried cuts of meat or fish. Dairy (2 to 3 servings)  Eat More Often: Low-fat or fat-free milk, low-fat plain or light yogurt, reduced-fat or part-skim cheese.  Eat Less Often/Avoid: Milk (whole, 2%).Whole milk yogurt. Full-fat cheeses. Nuts, Seeds, and Legumes (4 to 5 servings per week)  Eat More Often: All without added salt.  Eat Less Often/Avoid: Salted nuts and seeds, canned beans with added salt. Fats and Sweets (limited)  Eat More Often: Vegetable oils, tub margarines without trans fats, sugar-free gelatin. Mayonnaise and salad dressings.  Eat Less Often/Avoid: Coconut oils, palm oils, butter, stick margarine, cream, half and half, cookies, candy, pie. FOR MORE INFORMATION The Dash Diet Eating Plan: www.dashdiet.org Document Released: 11/15/2011 Document Revised: 02/18/2012 Document Reviewed: 11/15/2011 [facility] Patient Information 2014 [place], [place].

## 2013-07-15 ENCOUNTER — Encounter: Payer: Self-pay | Admitting: Family Medicine

## 2013-07-15 DIAGNOSIS — F4320 Adjustment disorder, unspecified: Secondary | ICD-10-CM

## 2013-07-15 DIAGNOSIS — F4321 Adjustment disorder with depressed mood: Secondary | ICD-10-CM

## 2013-07-15 HISTORY — DX: Adjustment disorder with depressed mood: F43.21

## 2013-07-15 HISTORY — DX: Adjustment disorder, unspecified: F43.20

## 2013-07-15 NOTE — Assessment & Plan Note (Signed)
Tolerating Lipitor, very mild increase in cholesterol, reminded to avoid trans fats, increase exercise start a krill oil cap

## 2013-07-15 NOTE — Assessment & Plan Note (Signed)
64 year old son died of colon cancer this past spring, he is managing well but still struggles with his grief. He will notify us if he needs any further assistance

## 2013-07-15 NOTE — Assessment & Plan Note (Signed)
Well controlled, no changes 

## 2013-07-15 NOTE — Progress Notes (Signed)
Patient ID: Taylor Newman, female   DOB: [birth year], 62 y.o.   MRN: [id number] Taylor Newman [id number] 1951-03-29 07/15/2013      Progress Note-Follow Up  Subjective  Chief Complaint  Chief Complaint  Patient presents with  . Follow-up    6 months    HPI  Patient is a 62 year old Caucasian female who is in today in followup. He is taking his medications as prescribed and physically has felt well. No recent illness. No chest pain, palpitations, shortness of breath, GI or GU concerns. He did suffer the loss of his 64 year old; cancer this past spring. Shortly after this occurred his other son came as transgender and air now helping him to the process of gender trans-formation  Past Medical History  Diagnosis Date  . History of chicken pox   . Heart murmur   . Hypertension   . Hyperlipidemia   . History of colon polyps   . Chest pain 01/12/2013  . Grief reaction 07/15/2013    Past Surgical History  Procedure Laterality Date  . Tonsillectomy  1954  . Colonoscopy      Family History  Problem Relation Age of Onset  . Colon cancer Father 34  . Hyperlipidemia Father   . Heart disease Father     stent placement  . Hypertension Father   . Breast cancer Sister   . Hyperlipidemia Mother   . Diabetes Maternal Grandmother     father  . Prostate cancer Neg Hx   . Cancer Son 61  . Colon cancer Son     History   Social History  . Marital Status: Married    Spouse Name: N/A    Number of Children: N/A  . Years of Education: N/A   Occupational History  . Not on file.   Social History Main Topics  . Smoking status: Current Every Day Smoker -- 1.00 packs/day for 45 years    Types: Cigarettes  . Smokeless tobacco: Never Used     Comment: 1 ppd  . Alcohol Use: 4.8 oz/week    8 Cans of beer per week  . Drug Use: No  . Sexually Active: Not on file   Other Topics Concern  . Not on file   Social History Narrative  . No narrative on file    Current Outpatient Prescriptions  on File Prior to Visit  Medication Sig Dispense Refill  . aspirin 81 MG chewable tablet Chew 81 mg by mouth daily.      Marland Kitchen atorvastatin (LIPITOR) 20 MG tablet Take 1 tablet (20 mg total) by mouth daily.  30 tablet  3  . lisinopril (PRINIVIL,ZESTRIL) 20 MG tablet TAKE 1 TABLET BY MOUTH ONCE DAILY  30 tablet  3  . neomycin-polymyxin-hydrocortisone (CORTISPORIN) 3.5-10000-1 otic suspension Place 4 drops into the right ear 3 (three) times daily.  10 mL  0  . tadalafil (CIALIS) 10 MG tablet Take 1 tablet (10 mg total) by mouth daily as needed for erectile dysfunction.  10 tablet  0   No current facility-administered medications on file prior to visit.    No Known Allergies  Review of Systems  Review of Systems  Constitutional: Negative for fever and malaise/fatigue.  HENT: Negative for congestion.   Eyes: Negative for pain and discharge.  Respiratory: Negative for shortness of breath.   Cardiovascular: Negative for chest pain, palpitations and leg swelling.  Gastrointestinal: Negative for nausea, abdominal pain and diarrhea.  Genitourinary: Negative for dysuria.  Musculoskeletal: Negative for falls.  Skin: Negative  for rash.  Neurological: Negative for loss of consciousness and headaches.  Endo/Heme/Allergies: Negative for polydipsia.  Psychiatric/Behavioral: Negative for depression and suicidal ideas. The patient is not nervous/anxious and does not have insomnia.     Objective  BP 128/90  Pulse 72  Temp(Src) 98.3 F (36.8 C) (Oral)  Ht 5\' 9"  (1.753 m)  Wt 163 lb 1.9 oz (73.991 kg)  BMI 24.08 kg/m2  SpO2 97%  Physical Exam  Physical Exam  Constitutional: He is oriented to person, place, and time and well-developed, well-nourished, and in no distress. No distress.  HENT:  Head: Normocephalic and atraumatic.  Eyes: Conjunctivae are normal.  Neck: Neck supple. No thyromegaly present.  Cardiovascular: Normal rate, regular rhythm and normal heart sounds.   No murmur  heard. Pulmonary/Chest: Effort normal and breath sounds normal. No respiratory distress.  Abdominal: He exhibits no distension and no mass. There is no tenderness.  Musculoskeletal: He exhibits no edema.  Neurological: He is alert and oriented to person, place, and time.  Skin: Skin is warm.  Psychiatric: Memory, affect and judgment normal.    Lab Results  Component Value Date   TSH 1.565 07/06/2013   Lab Results  Component Value Date   WBC 8.0 07/06/2013   HGB 16.1 07/06/2013   HCT 44.3 07/06/2013   MCV 94.1 07/06/2013   PLT 261 07/06/2013   Lab Results  Component Value Date   CREATININE 0.99 07/06/2013   BUN 18 07/06/2013   NA 138 07/06/2013   K 4.7 07/06/2013   CL 105 07/06/2013   CO2 25 07/06/2013   Lab Results  Component Value Date   ALT 14 07/06/2013   AST 18 07/06/2013   ALKPHOS 67 07/06/2013   BILITOT 0.9 07/06/2013   Lab Results  Component Value Date   CHOL 201* 07/06/2013   Lab Results  Component Value Date   HDL 57 07/06/2013   Lab Results  Component Value Date   LDLCALC 123* 07/06/2013   Lab Results  Component Value Date   TRIG 103 07/06/2013   Lab Results  Component Value Date   CHOLHDL 3.5 07/06/2013     Assessment & Plan  HTN (hypertension) Well controlled, no changes  Other and unspecified hyperlipidemia Tolerating Lipitor, very mild increase in cholesterol, reminded to avoid trans fats, increase exercise start a krill oil cap  Hyperglycemia Sugar up to 127 fasting, encouraged to minimize simple carbs and increase exercise. Check hgba1c  Grief reaction 25 year old son died of colon cancer this past spring, he is managing well but still struggles with his grief. He will notify us if he needs any further assistance

## 2013-07-15 NOTE — Assessment & Plan Note (Signed)
Sugar up to 127 fasting, encouraged to minimize simple carbs and increase exercise. Check hgba1c

## 2013-07-28 ENCOUNTER — Other Ambulatory Visit: Payer: Self-pay | Admitting: Family Medicine

## 2013-10-15 ENCOUNTER — Other Ambulatory Visit: Payer: Self-pay

## 2013-11-26 ENCOUNTER — Other Ambulatory Visit: Payer: Self-pay | Admitting: Family Medicine

## 2013-12-18 ENCOUNTER — Other Ambulatory Visit: Payer: Self-pay | Admitting: Family Medicine

## 2014-01-08 ENCOUNTER — Telehealth: Payer: Self-pay

## 2014-01-08 DIAGNOSIS — R739 Hyperglycemia, unspecified: Secondary | ICD-10-CM

## 2014-01-08 DIAGNOSIS — E785 Hyperlipidemia, unspecified: Secondary | ICD-10-CM

## 2014-01-08 DIAGNOSIS — I10 Essential (primary) hypertension: Secondary | ICD-10-CM

## 2014-01-08 LAB — HEPATIC FUNCTION PANEL
ALBUMIN: 4.5 g/dL (ref 3.5–5.2)
ALT: 18 U/L (ref 0–53)
AST: 16 U/L (ref 0–37)
Alkaline Phosphatase: 66 U/L (ref 39–117)
BILIRUBIN TOTAL: 1 mg/dL (ref 0.2–1.2)
Bilirubin, Direct: 0.2 mg/dL (ref 0.0–0.3)
Indirect Bilirubin: 0.8 mg/dL (ref 0.2–1.2)
Total Protein: 6.9 g/dL (ref 6.0–8.3)

## 2014-01-08 LAB — CBC
HEMATOCRIT: 44.8 % (ref 39.0–52.0)
HEMOGLOBIN: 16 g/dL (ref 13.0–17.0)
MCH: 33.1 pg (ref 26.0–34.0)
MCHC: 35.7 g/dL (ref 30.0–36.0)
MCV: 92.8 fL (ref 78.0–100.0)
Platelets: 278 10*3/uL (ref 150–400)
RBC: 4.83 MIL/uL (ref 4.22–5.81)
RDW: 13.1 % (ref 11.5–15.5)
WBC: 9.1 10*3/uL (ref 4.0–10.5)

## 2014-01-08 LAB — RENAL FUNCTION PANEL
Albumin: 4.5 g/dL (ref 3.5–5.2)
BUN: 13 mg/dL (ref 6–23)
CALCIUM: 9.4 mg/dL (ref 8.4–10.5)
CO2: 28 mEq/L (ref 19–32)
CREATININE: 0.85 mg/dL (ref 0.50–1.35)
Chloride: 99 mEq/L (ref 96–112)
Glucose, Bld: 105 mg/dL — ABNORMAL HIGH (ref 70–99)
POTASSIUM: 4.6 meq/L (ref 3.5–5.3)
Phosphorus: 3 mg/dL (ref 2.3–4.6)
Sodium: 135 mEq/L (ref 135–145)

## 2014-01-08 LAB — LIPID PANEL
Cholesterol: 219 mg/dL — ABNORMAL HIGH (ref 0–200)
HDL: 58 mg/dL (ref >39)
LDL Cholesterol: 128 mg/dL — ABNORMAL HIGH (ref 0–99)
Total CHOL/HDL Ratio: 3.8 Ratio
Triglycerides: 166 mg/dL — ABNORMAL HIGH (ref <150)
VLDL: 33 mg/dL (ref 0–40)

## 2014-01-08 LAB — TSH: TSH: 1.986 u[IU]/mL (ref 0.350–4.500)

## 2014-01-08 NOTE — Telephone Encounter (Signed)
Lab order placed.

## 2014-01-12 ENCOUNTER — Encounter: Payer: Self-pay | Admitting: Family Medicine

## 2014-01-12 ENCOUNTER — Ambulatory Visit: Payer: 59 | Admitting: Family Medicine

## 2014-01-12 ENCOUNTER — Ambulatory Visit (INDEPENDENT_AMBULATORY_CARE_PROVIDER_SITE_OTHER): Payer: 59 | Admitting: Family Medicine

## 2014-01-12 VITALS — BP 132/78 | HR 70 | Temp 97.9°F | Resp 18 | Ht 69.0 in | Wt 170.0 lb

## 2014-01-12 DIAGNOSIS — I10 Essential (primary) hypertension: Secondary | ICD-10-CM

## 2014-01-12 DIAGNOSIS — H60549 Acute eczematoid otitis externa, unspecified ear: Secondary | ICD-10-CM

## 2014-01-12 DIAGNOSIS — H60509 Unspecified acute noninfective otitis externa, unspecified ear: Secondary | ICD-10-CM

## 2014-01-12 DIAGNOSIS — F17200 Nicotine dependence, unspecified, uncomplicated: Secondary | ICD-10-CM

## 2014-01-12 DIAGNOSIS — L578 Other skin changes due to chronic exposure to nonionizing radiation: Secondary | ICD-10-CM

## 2014-01-12 DIAGNOSIS — E785 Hyperlipidemia, unspecified: Secondary | ICD-10-CM

## 2014-01-12 DIAGNOSIS — R7309 Other abnormal glucose: Secondary | ICD-10-CM

## 2014-01-12 DIAGNOSIS — R739 Hyperglycemia, unspecified: Secondary | ICD-10-CM

## 2014-01-12 DIAGNOSIS — H60399 Other infective otitis externa, unspecified ear: Secondary | ICD-10-CM

## 2014-01-12 MED ORDER — NEOMYCIN-POLYMYXIN-HC 3.5-10000-1 OT SUSP: 4.0000 [drp] | Freq: Three times a day (TID) | OTIC | Status: DC

## 2014-01-12 NOTE — Patient Instructions (Signed)

## 2014-01-12 NOTE — Progress Notes (Signed)
Pre visit review using our clinic review tool, if applicable. No additional management support is needed unless otherwise documented below in the visit note. 

## 2014-01-12 NOTE — Progress Notes (Signed)
Patient ID: Taylor Newman, female   DOB: [birth year], 63 y.o.   MRN: [id number] Taylor Newman [id number] 1951-10-11 01/12/2014      Progress Note-Follow Up  Subjective  Chief Complaint  Chief Complaint  Patient presents with  . Follow-up    6 month  . Nevus    pt stated he had a mole on the back with grayish color, and it itches from time to time    HPI  Patient is a 63 year old Caucasian female who is in today for routine followup. Unfortunately continues to smoke three quarters to one pack per day but is trying to cut back. He otherwise feels well. He denies any cough, chest pain or palpitations. No shortness of breath. He feels well. He has a spot on his back it's been mildly itchy but he otherwise reports feeling well.  Past Medical History  Diagnosis Date  . History of chicken pox   . Heart murmur   . Hypertension   . Hyperlipidemia   . History of colon polyps   . Chest pain 01/12/2013  . Grief reaction 07/15/2013    Past Surgical History  Procedure Laterality Date  . Tonsillectomy  1954  . Colonoscopy      Family History  Problem Relation Age of Onset  . Colon cancer Father 48  . Hyperlipidemia Father   . Heart disease Father     stent placement  . Hypertension Father   . Breast cancer Sister   . Hyperlipidemia Mother   . Diabetes Maternal Grandmother     father  . Prostate cancer Neg Hx   . Cancer Son 32  . Colon cancer Son     History   Social History  . Marital Status: Married    Spouse Name: N/A    Number of Children: N/A  . Years of Education: N/A   Occupational History  . Not on file.   Social History Main Topics  . Smoking status: Current Every Day Smoker -- 1.00 packs/day for 45 years    Types: Cigarettes  . Smokeless tobacco: Never Used     Comment: 1 ppd  . Alcohol Use: 4.8 oz/week    8 Cans of beer per week  . Drug Use: No  . Sexual Activity: Not on file   Other Topics Concern  . Not on file   Social History Narrative  . No  narrative on file    Current Outpatient Prescriptions on File Prior to Visit  Medication Sig Dispense Refill  . aspirin 81 MG chewable tablet Chew 81 mg by mouth daily.      Marland Kitchen atorvastatin (LIPITOR) 20 MG tablet TAKE 1 TABLET (20 MG TOTAL) BY MOUTH DAILY.  30 tablet  3  . lisinopril (PRINIVIL,ZESTRIL) 20 MG tablet TAKE 1 TABLET BY MOUTH ONCE DAILY  30 tablet  3  . neomycin-polymyxin-hydrocortisone (CORTISPORIN) 3.5-10000-1 otic suspension Place 4 drops into the right ear 3 (three) times daily.  10 mL  0   No current facility-administered medications on file prior to visit.    No Known Allergies  Review of Systems  Review of Systems  Constitutional: Negative for fever and malaise/fatigue.  HENT: Negative for congestion.   Eyes: Negative for discharge.  Respiratory: Negative for shortness of breath.   Cardiovascular: Negative for chest pain, palpitations and leg swelling.  Gastrointestinal: Negative for nausea, abdominal pain and diarrhea.  Genitourinary: Negative for dysuria.  Musculoskeletal: Negative for falls.  Skin: Negative for rash.  Neurological: Negative for loss of  consciousness and headaches.  Endo/Heme/Allergies: Negative for polydipsia.  Psychiatric/Behavioral: Negative for depression and suicidal ideas. The patient is not nervous/anxious and does not have insomnia.     Objective  BP 132/78  Pulse 70  Temp(Src) 97.9 F (36.6 C) (Oral)  Resp 18  Ht 5\' 9"  (1.753 m)  Wt 170 lb (77.111 kg)  BMI 25.09 kg/m2  SpO2 100%  Physical Exam  Physical Exam  Constitutional: He is oriented to person, place, and time and well-developed, well-nourished, and in no distress. No distress.  HENT:  Head: Normocephalic and atraumatic.  Eyes: Conjunctivae are normal.  Neck: Neck supple. No thyromegaly present.  Cardiovascular: Normal rate, regular rhythm and normal heart sounds.   No murmur heard. Pulmonary/Chest: Effort normal and breath sounds normal. No respiratory  distress.  Abdominal: He exhibits no distension and no mass. There is no tenderness.  Musculoskeletal: He exhibits no edema.  Neurological: He is alert and oriented to person, place, and time.  Skin: Skin is warm.  Psychiatric: Memory, affect and judgment normal.    Lab Results  Component Value Date   TSH 1.986 01/08/2014   Lab Results  Component Value Date   WBC 9.1 01/08/2014   HGB 16.0 01/08/2014   HCT 44.8 01/08/2014   MCV 92.8 01/08/2014   PLT 278 01/08/2014   Lab Results  Component Value Date   CREATININE 0.85 01/08/2014   BUN 13 01/08/2014   NA 135 01/08/2014   K 4.6 01/08/2014   CL 99 01/08/2014   CO2 28 01/08/2014   Lab Results  Component Value Date   ALT 18 01/08/2014   AST 16 01/08/2014   ALKPHOS 66 01/08/2014   BILITOT 1.0 01/08/2014   Lab Results  Component Value Date   CHOL 219* 01/08/2014   Lab Results  Component Value Date   HDL 58 01/08/2014   Lab Results  Component Value Date   LDLCALC 128* 01/08/2014   Lab Results  Component Value Date   TRIG 166* 01/08/2014   Lab Results  Component Value Date   CHOLHDL 3.8 01/08/2014     Assessment & Plan  HTN (hypertension) Well controlled no changes  Otitis externa, acute eczematoid Intermittent, given refill on ear drops to use prn.  Other and unspecified hyperlipidemia Continue current meds. Agrees to try and avoid trans fats and minimize simple carbs and saturated fats. Consider krill oil caps  Hyperglycemia Improved, check hgba1c with next labs. Minimize simple carbs.  Tobacco use disorder Encouraged complete cessation. Discussed strategies for cessation for greater than 3 minutes

## 2014-01-13 ENCOUNTER — Encounter: Payer: Self-pay | Admitting: Family Medicine

## 2014-01-13 DIAGNOSIS — Z87891 Personal history of nicotine dependence: Secondary | ICD-10-CM | POA: Insufficient documentation

## 2014-01-13 DIAGNOSIS — F17200 Nicotine dependence, unspecified, uncomplicated: Secondary | ICD-10-CM

## 2014-01-13 DIAGNOSIS — L578 Other skin changes due to chronic exposure to nonionizing radiation: Secondary | ICD-10-CM

## 2014-01-13 HISTORY — DX: Other skin changes due to chronic exposure to nonionizing radiation: L57.8

## 2014-01-13 HISTORY — DX: Personal history of nicotine dependence: Z87.891

## 2014-01-13 HISTORY — DX: Nicotine dependence, unspecified, uncomplicated: F17.200

## 2014-01-13 NOTE — Assessment & Plan Note (Signed)
Continue current meds. Agrees to try and avoid trans fats and minimize simple carbs and saturated fats. Consider krill oil caps

## 2014-01-13 NOTE — Assessment & Plan Note (Signed)
Encouraged complete cessation. Discussed strategies for cessation for greater than 3 minutes

## 2014-01-13 NOTE — Assessment & Plan Note (Signed)
Intermittent, given refill on ear drops to use prn.

## 2014-01-13 NOTE — Assessment & Plan Note (Signed)
Well controlled no changes 

## 2014-01-13 NOTE — Assessment & Plan Note (Signed)
Improved, check hgba1c with next labs. Minimize simple carbs.

## 2014-04-08 ENCOUNTER — Other Ambulatory Visit: Payer: Self-pay | Admitting: Family Medicine

## 2014-05-19 ENCOUNTER — Other Ambulatory Visit: Payer: Self-pay | Admitting: Family Medicine

## 2014-07-13 ENCOUNTER — Encounter: Payer: 59 | Admitting: Family Medicine

## 2014-07-30 ENCOUNTER — Encounter: Payer: 59 | Admitting: Family Medicine

## 2014-08-10 ENCOUNTER — Other Ambulatory Visit: Payer: Self-pay

## 2014-08-10 ENCOUNTER — Other Ambulatory Visit (INDEPENDENT_AMBULATORY_CARE_PROVIDER_SITE_OTHER): Payer: 59

## 2014-08-10 DIAGNOSIS — Z0000 Encounter for general adult medical examination without abnormal findings: Secondary | ICD-10-CM

## 2014-08-10 LAB — HEPATIC FUNCTION PANEL
ALT: 16 U/L (ref 0–53)
AST: 20 U/L (ref 0–37)
Albumin: 4 g/dL (ref 3.5–5.2)
Alkaline Phosphatase: 71 U/L (ref 39–117)
BILIRUBIN DIRECT: 0.1 mg/dL (ref 0.0–0.3)
TOTAL PROTEIN: 6.5 g/dL (ref 6.0–8.3)
Total Bilirubin: 0.8 mg/dL (ref 0.2–1.2)

## 2014-08-10 LAB — CBC WITH DIFFERENTIAL/PLATELET
BASOS ABS: 0 10*3/uL (ref 0.0–0.1)
Basophils Relative: 0.3 % (ref 0.0–3.0)
Eosinophils Absolute: 0.1 10*3/uL (ref 0.0–0.7)
Eosinophils Relative: 1.5 % (ref 0.0–5.0)
HEMATOCRIT: 44.8 % (ref 39.0–52.0)
HEMOGLOBIN: 15.3 g/dL (ref 13.0–17.0)
Lymphocytes Relative: 22.5 % (ref 12.0–46.0)
Lymphs Abs: 1.7 10*3/uL (ref 0.7–4.0)
MCHC: 34 g/dL (ref 30.0–36.0)
MCV: 99.6 fl (ref 78.0–100.0)
MONOS PCT: 5.6 % (ref 3.0–12.0)
Monocytes Absolute: 0.4 10*3/uL (ref 0.1–1.0)
NEUTROS ABS: 5.2 10*3/uL (ref 1.4–7.7)
Neutrophils Relative %: 70.1 % (ref 43.0–77.0)
Platelets: 255 10*3/uL (ref 150.0–400.0)
RBC: 4.5 Mil/uL (ref 4.22–5.81)
RDW: 13.7 % (ref 11.5–15.5)
WBC: 7.4 10*3/uL (ref 4.0–10.5)

## 2014-08-10 LAB — PSA: PSA: 0.3 ng/mL (ref 0.10–4.00)

## 2014-08-10 LAB — TSH: TSH: 1.17 u[IU]/mL (ref 0.35–4.50)

## 2014-08-10 LAB — HEMOGLOBIN A1C: Hgb A1c MFr Bld: 5.3 % (ref 4.6–6.5)

## 2014-08-13 ENCOUNTER — Ambulatory Visit (INDEPENDENT_AMBULATORY_CARE_PROVIDER_SITE_OTHER): Payer: 59 | Admitting: Family Medicine

## 2014-08-13 ENCOUNTER — Encounter: Payer: Self-pay | Admitting: Family Medicine

## 2014-08-13 VITALS — BP 120/80 | HR 71 | Temp 98.1°F | Ht 69.0 in | Wt 174.4 lb

## 2014-08-13 DIAGNOSIS — R7309 Other abnormal glucose: Secondary | ICD-10-CM

## 2014-08-13 DIAGNOSIS — I10 Essential (primary) hypertension: Secondary | ICD-10-CM

## 2014-08-13 DIAGNOSIS — Z0000 Encounter for general adult medical examination without abnormal findings: Secondary | ICD-10-CM

## 2014-08-13 DIAGNOSIS — F17200 Nicotine dependence, unspecified, uncomplicated: Secondary | ICD-10-CM

## 2014-08-13 DIAGNOSIS — R739 Hyperglycemia, unspecified: Secondary | ICD-10-CM

## 2014-08-13 DIAGNOSIS — E785 Hyperlipidemia, unspecified: Secondary | ICD-10-CM

## 2014-08-13 HISTORY — DX: Encounter for general adult medical examination without abnormal findings: Z00.00

## 2014-08-13 NOTE — Assessment & Plan Note (Signed)
Well controlled, no changes to meds. Encouraged heart healthy diet such as the DASH diet and exercise as tolerated.  °

## 2014-08-13 NOTE — Assessment & Plan Note (Signed)
Patient encouraged to maintain heart healthy diet, regular exercise, adequate sleep. Consider daily probiotics. Take medications as prescribed 

## 2014-08-13 NOTE — Assessment & Plan Note (Signed)
hgba1c acceptable, minimize simple carbs. Increase exercise as tolerated.  

## 2014-08-13 NOTE — Assessment & Plan Note (Signed)
Tolerating statin, encouraged heart healthy diet, avoid trans fats, minimize simple carbs and saturated fats. Increase exercise as tolerated 

## 2014-08-13 NOTE — Patient Instructions (Signed)
Check PSA with next blood draw  Hypertension Hypertension, commonly called high blood pressure, is when the force of blood pumping through your arteries is too strong. Your arteries are the blood vessels that carry blood from your heart throughout your body. A blood pressure reading consists of a higher number over a lower number, such as 110/72. The higher number (systolic) is the pressure inside your arteries when your heart pumps. The lower number (diastolic) is the pressure inside your arteries when your heart relaxes. Ideally you want your blood pressure below 120/80. Hypertension forces your heart to work harder to pump blood. Your arteries may become narrow or stiff. Having hypertension puts you at risk for heart disease, stroke, and other problems.  RISK FACTORS Some risk factors for high blood pressure are controllable. Others are not.  Risk factors you cannot control include:   Race. You may be at higher risk if you are African American.  Age. Risk increases with age.  Gender. Men are at higher risk than women before age 34 years. After age 50, women are at higher risk than men. Risk factors you can control include:  Not getting enough exercise or physical activity.  Being overweight.  Getting too much fat, sugar, calories, or salt in your diet.  Drinking too much alcohol. SIGNS AND SYMPTOMS Hypertension does not usually cause signs or symptoms. Extremely high blood pressure (hypertensive crisis) may cause headache, anxiety, shortness of breath, and nosebleed. DIAGNOSIS  To check if you have hypertension, your health care provider will measure your blood pressure while you are seated, with your arm held at the level of your heart. It should be measured at least twice using the same arm. Certain conditions can cause a difference in blood pressure between your right and left arms. A blood pressure reading that is higher than normal on one occasion does not mean that you need  treatment. If one blood pressure reading is high, ask your health care provider about having it checked again. TREATMENT  Treating high blood pressure includes making lifestyle changes and possibly taking medicine. Living a healthy lifestyle can help lower high blood pressure. You may need to change some of your habits. Lifestyle changes may include:  Following the DASH diet. This diet is high in fruits, vegetables, and whole grains. It is low in salt, red meat, and added sugars.  Getting at least 2 hours of brisk physical activity every week.  Losing weight if necessary.  Not smoking.  Limiting alcoholic beverages.  Learning ways to reduce stress. If lifestyle changes are not enough to get your blood pressure under control, your health care provider may prescribe medicine. You may need to take more than one. Work closely with your health care provider to understand the risks and benefits. HOME CARE INSTRUCTIONS  Have your blood pressure rechecked as directed by your health care provider.   Take medicines only as directed by your health care provider. Follow the directions carefully. Blood pressure medicines must be taken as prescribed. The medicine does not work as well when you skip doses. Skipping doses also puts you at risk for problems.   Do not smoke.   Monitor your blood pressure at home as directed by your health care provider. SEEK MEDICAL CARE IF:   You think you are having a reaction to medicines taken.  You have recurrent headaches or feel dizzy.  You have swelling in your ankles.  You have trouble with your vision. SEEK IMMEDIATE MEDICAL CARE IF:  You develop a severe headache or confusion.  You have unusual weakness, numbness, or feel faint.  You have severe chest or abdominal pain.  You vomit repeatedly.  You have trouble breathing. MAKE SURE YOU:   Understand these instructions.  Will watch your condition.  Will get help right away if you are  not doing well or get worse. Document Released: 11/26/2005 Document Revised: 04/12/2014 Document Reviewed: 09/18/2013 [facility] Patient Information 2015 [place], [place]. This information is not intended to replace advice given to you by your health care provider. Make sure you discuss any questions you have with your health care provider.

## 2014-08-13 NOTE — Progress Notes (Signed)
Pre visit review using our clinic review tool, if applicable. No additional management support is needed unless otherwise documented below in the visit note. 

## 2014-08-13 NOTE — Progress Notes (Signed)
Patient ID: Naturi Alarid, female   DOB: 28-Aug-1951, 63 y.o.   MRN: [id number] Valree Feild [id number] [birth year] 08/13/2014      Progress Note-Follow Up  Subjective  Chief Complaint  Chief Complaint  Patient presents with  . Annual Exam    physical    HPI  Patient is a 63 year old female in today for routine medical care. No recent illness or concerns, is eating very little red meat. Has cut down on processed and fried foods,. Is trying to maintain a heart healthy diet and staying active. Denies CP/palp/SOB/HA/congestion/fevers/GI or GU c/o. Taking meds as prescribed  Past Medical History  Diagnosis Date  . History of chicken pox   . Heart murmur   . Hypertension   . Hyperlipidemia   . History of colon polyps   . Chest pain 01/12/2013  . Grief reaction 07/15/2013  . Sun-damaged skin 01/13/2014  . Tobacco use disorder 01/13/2014  . Preventative health care 08/13/2014    Past Surgical History  Procedure Laterality Date  . Tonsillectomy  1954  . Colonoscopy      Family History  Problem Relation Age of Onset  . Colon cancer Father 54  . Hyperlipidemia Father   . Heart disease Father     stent placement  . Hypertension Father   . Breast cancer Sister   . Hyperlipidemia Mother   . Diabetes Maternal Grandmother     father  . Prostate cancer Neg Hx   . Cancer Son 8  . Colon cancer Son     History   Social History  . Marital Status: Married    Spouse Name: N/A    Number of Children: N/A  . Years of Education: N/A   Occupational History  . Not on file.   Social History Main Topics  . Smoking status: Current Every Day Smoker -- 1.00 packs/day for 45 years    Types: Cigarettes  . Smokeless tobacco: Never Used     Comment: 1 ppd  . Alcohol Use: 4.8 oz/week    8 Cans of beer per week  . Drug Use: No  . Sexual Activity: Not on file   Other Topics Concern  . Not on file   Social History Narrative  . No narrative on file    Current Outpatient Prescriptions on File  Prior to Visit  Medication Sig Dispense Refill  . aspirin 81 MG chewable tablet Chew 81 mg by mouth daily.      Marland Kitchen atorvastatin (LIPITOR) 20 MG tablet TAKE 1 TABLET (20 MG TOTAL) BY MOUTH DAILY.  30 tablet  3  . lisinopril (PRINIVIL,ZESTRIL) 20 MG tablet TAKE 1 TABLET BY MOUTH ONCE DAILY  30 tablet  3  . neomycin-polymyxin-hydrocortisone (CORTISPORIN) 3.5-10000-1 otic suspension Place 4 drops into the right ear 3 (three) times daily.  10 mL  0   No current facility-administered medications on file prior to visit.    No Known Allergies  Review of Systems  Review of Systems  Constitutional: Negative for fever, chills and malaise/fatigue.  HENT: Negative for congestion, hearing loss and nosebleeds.   Eyes: Negative for discharge.  Respiratory: Negative for cough, sputum production, shortness of breath and wheezing.   Cardiovascular: Negative for chest pain, palpitations and leg swelling.  Gastrointestinal: Negative for heartburn, nausea, vomiting, abdominal pain, diarrhea, constipation and blood in stool.  Genitourinary: Negative for dysuria, urgency, frequency and hematuria.  Musculoskeletal: Negative for back pain, falls and myalgias.  Skin: Negative for rash.  Neurological: Negative for  dizziness, tremors, sensory change, focal weakness, loss of consciousness, weakness and headaches.  Endo/Heme/Allergies: Negative for polydipsia. Does not bruise/bleed easily.  Psychiatric/Behavioral: Negative for depression and suicidal ideas. The patient is not nervous/anxious and does not have insomnia.     Objective  BP 120/80  Pulse 71  Temp(Src) 98.1 F (36.7 C) (Oral)  Ht 5\' 9"  (1.753 m)  Wt 174 lb 6.4 oz (79.107 kg)  BMI 25.74 kg/m2  SpO2 95%  Physical Exam  Physical Exam  Constitutional: He is oriented to person, place, and time and well-developed, well-nourished, and in no distress. No distress.  HENT:  Head: Normocephalic and atraumatic.  Eyes: Conjunctivae are normal.  Neck:  Neck supple. No thyromegaly present.  Cardiovascular: Normal rate, regular rhythm and normal heart sounds.   No murmur heard. Pulmonary/Chest: Effort normal and breath sounds normal. No respiratory distress.  Abdominal: He exhibits no distension and no mass. There is no tenderness.  Musculoskeletal: He exhibits no edema.  Neurological: He is alert and oriented to person, place, and time.  Skin: Skin is warm.  Psychiatric: Memory, affect and judgment normal.    Lab Results  Component Value Date   TSH 1.17 08/10/2014   Lab Results  Component Value Date   WBC 7.4 08/10/2014   HGB 15.3 08/10/2014   HCT 44.8 08/10/2014   MCV 99.6 08/10/2014   PLT 255.0 08/10/2014   Lab Results  Component Value Date   CREATININE 0.85 01/08/2014   BUN 13 01/08/2014   NA 135 01/08/2014   K 4.6 01/08/2014   CL 99 01/08/2014   CO2 28 01/08/2014   Lab Results  Component Value Date   ALT 16 08/10/2014   AST 20 08/10/2014   ALKPHOS 71 08/10/2014   BILITOT 0.8 08/10/2014   Lab Results  Component Value Date   CHOL 219* 01/08/2014   Lab Results  Component Value Date   HDL 58 01/08/2014   Lab Results  Component Value Date   LDLCALC 128* 01/08/2014   Lab Results  Component Value Date   TRIG 166* 01/08/2014   Lab Results  Component Value Date   CHOLHDL 3.8 01/08/2014     Assessment & Plan   HTN (hypertension) Well controlled, no changes to meds. Encouraged heart healthy diet such as the DASH diet and exercise as tolerated.   Hyperglycemia hgba1c acceptable, minimize simple carbs. Increase exercise as tolerated.  Other and unspecified hyperlipidemia Tolerating statin, encouraged heart healthy diet, avoid trans fats, minimize simple carbs and saturated fats. Increase exercise as tolerated  Preventative health care Patient encouraged to maintain heart healthy diet, regular exercise, adequate sleep. Consider daily probiotics. Take medications as prescribed  Tobacco use disorder Encouraged complete cessation.  Discussed need to quit as relates to risk of numerous cancers, cardiac and pulmonary disease as well as neurologic complications. Counseled for greater than 3 minutes. At 1 ppd

## 2014-08-13 NOTE — Assessment & Plan Note (Signed)
Encouraged complete cessation. Discussed need to quit as relates to risk of numerous cancers, cardiac and pulmonary disease as well as neurologic complications. Counseled for greater than 3 minutes. At 1 ppd

## 2014-09-13 ENCOUNTER — Other Ambulatory Visit: Payer: Self-pay | Admitting: Family Medicine

## 2014-09-13 NOTE — Telephone Encounter (Signed)
Rx request to pharmacy/[facility]  

## 2014-09-30 ENCOUNTER — Other Ambulatory Visit: Payer: Self-pay | Admitting: Family Medicine

## 2014-10-11 ENCOUNTER — Encounter: Payer: Self-pay | Admitting: *Deleted

## 2014-10-11 ENCOUNTER — Emergency Department
Admission: EM | Admit: 2014-10-11 | Discharge: 2014-10-11 | Disposition: A | Discharge status: Home / Self Care | Payer: 59 | Source: Home / Self Care | Attending: Family Medicine | Admitting: Family Medicine

## 2014-10-11 DIAGNOSIS — S61411A Laceration without foreign body of right hand, initial encounter: Secondary | ICD-10-CM

## 2014-10-11 NOTE — ED Provider Notes (Signed)
Taylor Newman is a 63 y.o. female who presents to Urgent Care today for Laceration. Patient suffered a laceration to the volar thenar eminence of his right hand. Injury occurred at work on a piece of glass. He denies any numbness or weakness or difficulty with motion. He has not tried any medications yet. He is up-to-date with tetanus vaccination.   Past Medical History  Diagnosis Date  . History of chicken pox   . Heart murmur   . Hypertension   . Hyperlipidemia   . History of colon polyps   . Chest pain 01/12/2013  . Grief reaction 07/15/2013  . Sun-damaged skin 01/13/2014  . Tobacco use disorder 01/13/2014  . Preventative health care 08/13/2014   Past Surgical History  Procedure Laterality Date  . Tonsillectomy  1954  . Colonoscopy     History  Substance Use Topics  . Smoking status: Current Every Day Smoker -- 1.00 packs/day for 45 years    Types: Cigarettes  . Smokeless tobacco: Never Used     Comment: 1 ppd  . Alcohol Use: 4.8 oz/week    8 Cans of beer per week   ROS as above Medications: No current facility-administered medications for this encounter.   Current Outpatient Prescriptions  Medication Sig Dispense Refill  . aspirin 81 MG chewable tablet Chew 81 mg by mouth daily.    Marland Kitchen atorvastatin (LIPITOR) 20 MG tablet TAKE 1 TABLET (20 MG) BY MOUTH DAILY. 30 tablet 3  . KRILL OIL PO Take 1 capsule by mouth daily.    Marland Kitchen lactobacillus acidophilus (BACID) TABS tablet Take 1 tablet by mouth daily.    Marland Kitchen lisinopril (PRINIVIL,ZESTRIL) 20 MG tablet TAKE 1 TABLET BY MOUTH ONCE DAILY 30 tablet 3  . Multiple Vitamin (MULTIVITAMIN) tablet Take 1 tablet by mouth daily.    . psyllium (REGULOID) 0.52 G capsule Take 0.52 g by mouth daily.     No Known Allergies   Exam:  BP 150/99 mmHg  Pulse 87  Temp(Src) 98.2 F (36.8 C) (Oral)  Ht 5\' 8"  (1.727 m)  Wt 170 lb (77.111 kg)  BMI 25.85 kg/m2 Gen: Well NAD Right hand: he centimeter shallow laceration to the right volar thenar eminence. No  deep structures visible. Hand motion is intact as is sensation and capillary refill distally. Pulses are intact at the wrist.  Laceration repair: Consent obtained and timeout performed. 1 mL of lidocaine with epinephrine used with local and filtration to anesthetize the wound. Surrounding skin cleaned with Betadine. The wound was copiously irrigated with sterile saline. The wound was prepped and draped in the usual sterile fashion 3 running simple sutures were used to close the wound. A dressing was applied. Patient tolerated the procedure well.  No results found for this or any previous visit (from the past 24 hour(s)). No results found.  Assessment and Plan: 63 y.o. female with right hand laceration. Follow-up in one week for suture removal. Wound care instructions reviewed.  Discussed warning signs or symptoms. Please see discharge instructions. Patient expresses understanding.     Gregor Hams, MD 10/11/14 1800

## 2014-10-11 NOTE — Discharge Instructions (Signed)
Thank you for coming in today. Keep the wound clean and covered in ointment.  Return in 1 week or so for suture removal.   Laceration Care, Adult A laceration is a cut or lesion that goes through all layers of the skin and into the tissue just beneath the skin. TREATMENT  Some lacerations may not require closure. Some lacerations may not be able to be closed due to an increased risk of infection. It is important to see your caregiver as soon as possible after an injury to minimize the risk of infection and maximize the opportunity for successful closure. If closure is appropriate, pain medicines may be given, if needed. The wound will be cleaned to help prevent infection. Your caregiver will use stitches (sutures), staples, wound glue (adhesive), or skin adhesive strips to repair the laceration. These tools bring the skin edges together to allow for faster healing and a better cosmetic outcome. However, all wounds will heal with a scar. Once the wound has healed, scarring can be minimized by covering the wound with sunscreen during the day for 1 full year. HOME CARE INSTRUCTIONS  For sutures or staples:  Keep the wound clean and dry.  If you were given a bandage (dressing), you should change it at least once a day. Also, change the dressing if it becomes wet or dirty, or as directed by your caregiver.  Wash the wound with soap and water 2 times a day. Rinse the wound off with water to remove all soap. Pat the wound dry with a clean towel.  After cleaning, apply a thin layer of the antibiotic ointment as recommended by your caregiver. This will help prevent infection and keep the dressing from sticking.  You may shower as usual after the first 24 hours. Do not soak the wound in water until the sutures are removed.  Only take over-the-counter or prescription medicines for pain, discomfort, or fever as directed by your caregiver.  Get your sutures or staples removed as directed by your  caregiver. For skin adhesive strips:  Keep the wound clean and dry.  Do not get the skin adhesive strips wet. You may bathe carefully, using caution to keep the wound dry.  If the wound gets wet, pat it dry with a clean towel.  Skin adhesive strips will fall off on their own. You may trim the strips as the wound heals. Do not remove skin adhesive strips that are still stuck to the wound. They will fall off in time. For wound adhesive:  You may briefly wet your wound in the shower or bath. Do not soak or scrub the wound. Do not swim. Avoid periods of heavy perspiration until the skin adhesive has fallen off on its own. After showering or bathing, gently pat the wound dry with a clean towel.  Do not apply liquid medicine, cream medicine, or ointment medicine to your wound while the skin adhesive is in place. This may loosen the film before your wound is healed.  If a dressing is placed over the wound, be careful not to apply tape directly over the skin adhesive. This may cause the adhesive to be pulled off before the wound is healed.  Avoid prolonged exposure to sunlight or tanning lamps while the skin adhesive is in place. Exposure to ultraviolet light in the first year will darken the scar.  The skin adhesive will usually remain in place for 5 to 10 days, then naturally fall off the skin. Do not pick at the adhesive  film. You may need a tetanus shot if:  You cannot remember when you had your last tetanus shot.  You have never had a tetanus shot. If you get a tetanus shot, your arm may swell, get red, and feel warm to the touch. This is common and not a problem. If you need a tetanus shot and you choose not to have one, there is a rare chance of getting tetanus. Sickness from tetanus can be serious. SEEK MEDICAL CARE IF:   You have redness, swelling, or increasing pain in the wound.  You see a red line that goes away from the wound.  You have yellowish-white fluid (pus) coming from  the wound.  You have a fever.  You notice a bad smell coming from the wound or dressing.  Your wound breaks open before or after sutures have been removed.  You notice something coming out of the wound such as wood or glass.  Your wound is on your hand or foot and you cannot move a finger or toe. SEEK IMMEDIATE MEDICAL CARE IF:   Your pain is not controlled with prescribed medicine.  You have severe swelling around the wound causing pain and numbness or a change in color in your arm, hand, leg, or foot.  Your wound splits open and starts bleeding.  You have worsening numbness, weakness, or loss of function of any joint around or beyond the wound.  You develop painful lumps near the wound or on the skin anywhere on your body. MAKE SURE YOU:   Understand these instructions.  Will watch your condition.  Will get help right away if you are not doing well or get worse. Document Released: 11/26/2005 Document Revised: 02/18/2012 Document Reviewed: 05/22/2011 [facility] Patient Information 2015 [place], [place]. This information is not intended to replace advice given to you by your health care provider. Make sure you discuss any questions you have with your health care provider.

## 2014-10-11 NOTE — ED Notes (Signed)
Taylor Newman cut his right hand pushing trash down in the can. UTD on tetanus.

## 2014-10-16 MED ORDER — DOXYCYCLINE HYCLATE 100 MG PO CAPS: 100.0000 mg | ORAL_CAPSULE | Freq: Two times a day (BID) | ORAL | Status: AC

## 2014-10-19 ENCOUNTER — Encounter: Payer: Self-pay | Admitting: Family Medicine

## 2014-10-19 ENCOUNTER — Ambulatory Visit (INDEPENDENT_AMBULATORY_CARE_PROVIDER_SITE_OTHER): Payer: 59 | Admitting: Family Medicine

## 2014-10-19 VITALS — BP 130/86 | HR 76 | Temp 98.2°F | Ht 69.0 in | Wt 175.0 lb

## 2014-10-19 DIAGNOSIS — S61411D Laceration without foreign body of right hand, subsequent encounter: Secondary | ICD-10-CM

## 2014-10-19 DIAGNOSIS — E785 Hyperlipidemia, unspecified: Secondary | ICD-10-CM

## 2014-10-19 DIAGNOSIS — I10 Essential (primary) hypertension: Secondary | ICD-10-CM

## 2014-10-19 LAB — WOUND CULTURE
Gram Stain: NONE SEEN
Gram Stain: NONE SEEN

## 2014-10-19 NOTE — Progress Notes (Signed)
Pre visit review using our clinic review tool, if applicable. No additional management support is needed unless otherwise documented below in the visit note. 

## 2014-10-19 NOTE — Patient Instructions (Signed)

## 2014-10-20 ENCOUNTER — Telehealth: Payer: Self-pay | Admitting: Emergency Medicine

## 2014-10-24 ENCOUNTER — Encounter: Payer: Self-pay | Admitting: Family Medicine

## 2014-10-24 DIAGNOSIS — S61419A Laceration without foreign body of unspecified hand, initial encounter: Secondary | ICD-10-CM | POA: Insufficient documentation

## 2014-10-24 HISTORY — DX: Laceration without foreign body of unspecified hand, initial encounter: S61.419A

## 2014-10-24 NOTE — Assessment & Plan Note (Signed)
Well controlled, no changes to meds. Encouraged heart healthy diet such as the DASH diet and exercise as tolerated.  °

## 2014-10-24 NOTE — Progress Notes (Signed)
Taylor Newman  [id number] February 13, 1951 10/24/2014      Progress Note-Follow Up  Subjective  Chief Complaint  Chief Complaint  Patient presents with  . got stitches Nov 2    stitches were removed on 10-16-14- due to afraid there was an infection- right hand- started Doxycycline    HPI  Patient is a 63 y.o. female in today for routine medical care. He is in today for follow up on  Aright hand laceration. He had stitches placed that unfortunately they had to be removed due to infection he is now feeling better now. Pain and discharge are down now. No fevers/chills. Denies CP/palp/SOB/HA/congestion/fevers/GI or GU c/o. Taking meds as prescribed  Past Medical History  Diagnosis Date  . History of chicken pox   . Heart murmur   . Hypertension   . Hyperlipidemia   . History of colon polyps   . Chest pain 01/12/2013  . Grief reaction 07/15/2013  . Sun-damaged skin 01/13/2014  . Tobacco use disorder 01/13/2014  . Preventative health care 08/13/2014  . Laceration of hand 10/24/2014    Past Surgical History  Procedure Laterality Date  . Tonsillectomy  1954  . Colonoscopy      Family History  Problem Relation Age of Onset  . Colon cancer Father 28  . Hyperlipidemia Father   . Heart disease Father     stent placement  . Hypertension Father   . Breast cancer Sister   . Hyperlipidemia Mother   . Diabetes Maternal Grandmother     father  . Prostate cancer Neg Hx   . Cancer Son 68  . Colon cancer Son     History   Social History  . Marital Status: Married    Spouse Name: N/A    Number of Children: N/A  . Years of Education: N/A   Occupational History  . Not on file.   Social History Main Topics  . Smoking status: Current Every Day Smoker -- 1.00 packs/day for 45 years    Types: Cigarettes  . Smokeless tobacco: Never Used     Comment: 1 ppd  . Alcohol Use: 4.8 oz/week    8 Cans of beer per week  . Drug Use: No  . Sexual Activity: Not on file   Other Topics Concern  .  Not on file   Social History Narrative    Current Outpatient Prescriptions on File Prior to Visit  Medication Sig Dispense Refill  . aspirin 81 MG chewable tablet Chew 81 mg by mouth daily.    Marland Kitchen atorvastatin (LIPITOR) 20 MG tablet TAKE 1 TABLET (20 MG) BY MOUTH DAILY. 30 tablet 3  . KRILL OIL PO Take 1 capsule by mouth daily.    Marland Kitchen lactobacillus acidophilus (BACID) TABS tablet Take 1 tablet by mouth daily.    Marland Kitchen lisinopril (PRINIVIL,ZESTRIL) 20 MG tablet TAKE 1 TABLET BY MOUTH ONCE DAILY 30 tablet 3  . Multiple Vitamin (MULTIVITAMIN) tablet Take 1 tablet by mouth daily.    . psyllium (REGULOID) 0.52 G capsule Take 0.52 g by mouth daily.     No current facility-administered medications on file prior to visit.    No Known Allergies  Review of Systems  ROS  Objective  BP 130/86 mmHg  Pulse 76  Temp(Src) 98.2 F (36.8 C) (Oral)  Ht 5\' 9"  (1.753 m)  Wt 175 lb (79.379 kg)  BMI 25.83 kg/m2  SpO2 98%  Physical Exam  Physical Exam  Lab Results  Component Value Date  TSH 1.17 08/10/2014   Lab Results  Component Value Date   WBC 7.4 08/10/2014   HGB 15.3 08/10/2014   HCT 44.8 08/10/2014   MCV 99.6 08/10/2014   PLT 255.0 08/10/2014   Lab Results  Component Value Date   CREATININE 0.85 01/08/2014   BUN 13 01/08/2014   NA 135 01/08/2014   K 4.6 01/08/2014   CL 99 01/08/2014   CO2 28 01/08/2014   Lab Results  Component Value Date   ALT 16 08/10/2014   AST 20 08/10/2014   ALKPHOS 71 08/10/2014   BILITOT 0.8 08/10/2014   Lab Results  Component Value Date   CHOL 219* 01/08/2014   Lab Results  Component Value Date   HDL 58 01/08/2014   Lab Results  Component Value Date   LDLCALC 128* 01/08/2014   Lab Results  Component Value Date   TRIG 166* 01/08/2014   Lab Results  Component Value Date   CHOLHDL 3.8 01/08/2014     Assessment & Plan  HTN (hypertension) Well controlled, no changes to meds. Encouraged heart healthy diet such as the DASH diet and  exercise as tolerated.   Hyperlipidemia Tolerating statin, encouraged heart healthy diet, avoid trans fats, minimize simple carbs and saturated fats. Increase exercise as tolerated  Laceration of hand Right hand required stitches last week, unfortunately the sutures had to be pulled out due to infection. Is doing well now and is healing well. Keep clean and try. Report any concerning pain or symptoms

## 2014-10-24 NOTE — Assessment & Plan Note (Signed)
Right hand required stitches last week, unfortunately the sutures had to be pulled out due to infection. Is doing well now and is healing well. Keep clean and try. Report any concerning pain or symptoms

## 2014-10-24 NOTE — Assessment & Plan Note (Signed)
Tolerating statin, encouraged heart healthy diet, avoid trans fats, minimize simple carbs and saturated fats. Increase exercise as tolerated 

## 2014-10-29 ENCOUNTER — Telehealth: Payer: Self-pay | Admitting: Family Medicine

## 2014-10-29 NOTE — Telephone Encounter (Signed)
emmi emailed °

## 2015-01-04 ENCOUNTER — Telehealth: Payer: Self-pay | Admitting: Family Medicine

## 2015-01-04 DIAGNOSIS — N528 Other male erectile dysfunction: Secondary | ICD-10-CM

## 2015-01-04 NOTE — Telephone Encounter (Signed)
Received fax from pharmacy requesting refills Cilias 20mg  1/2 tablet daily as needed. Medication no longer on current list. Pt has follow up 02/11/15. Please advise.

## 2015-01-05 NOTE — Telephone Encounter (Signed)
Not sure the question here. Do we want to switch to something else? If so what is on list and what has he tried in past? Did it work?

## 2015-01-05 NOTE — Telephone Encounter (Signed)
OK to write him a prescription for the Cialis 20 mg tabs 1/2 to 1 tab po daily prn ED disp # 6 with 2 rf

## 2015-01-05 NOTE — Telephone Encounter (Signed)
Patient states that he used in the past but it has been several years. States problem went away but has now recurred.  Please advise.  Has appt in March

## 2015-01-06 MED ORDER — TADALAFIL 20 MG PO TABS: ORAL_TABLET | ORAL | Status: DC

## 2015-01-06 NOTE — Telephone Encounter (Signed)
Sent Rx to pharmacy--[facility] LM for patient.

## 2015-02-11 ENCOUNTER — Encounter: Payer: Self-pay | Admitting: Family Medicine

## 2015-02-11 ENCOUNTER — Ambulatory Visit (INDEPENDENT_AMBULATORY_CARE_PROVIDER_SITE_OTHER): Payer: Medicare Other | Admitting: Family Medicine

## 2015-02-11 VITALS — BP 130/82 | HR 77 | Temp 98.4°F | Ht 68.0 in | Wt 172.4 lb

## 2015-02-11 DIAGNOSIS — I10 Essential (primary) hypertension: Secondary | ICD-10-CM | POA: Diagnosis not present

## 2015-02-11 DIAGNOSIS — F17200 Nicotine dependence, unspecified, uncomplicated: Secondary | ICD-10-CM

## 2015-02-11 DIAGNOSIS — N528 Other male erectile dysfunction: Secondary | ICD-10-CM | POA: Diagnosis not present

## 2015-02-11 DIAGNOSIS — Z8481 Family history of carrier of genetic disease: Secondary | ICD-10-CM

## 2015-02-11 DIAGNOSIS — Z23 Encounter for immunization: Secondary | ICD-10-CM

## 2015-02-11 DIAGNOSIS — E782 Mixed hyperlipidemia: Secondary | ICD-10-CM | POA: Diagnosis not present

## 2015-02-11 DIAGNOSIS — Z720 Tobacco use: Secondary | ICD-10-CM

## 2015-02-11 DIAGNOSIS — R739 Hyperglycemia, unspecified: Secondary | ICD-10-CM | POA: Diagnosis not present

## 2015-02-11 DIAGNOSIS — E785 Hyperlipidemia, unspecified: Secondary | ICD-10-CM

## 2015-02-11 LAB — CBC
HCT: 47.1 % (ref 39.0–52.0)
HEMOGLOBIN: 16.5 g/dL (ref 13.0–17.0)
MCH: 33.7 pg (ref 26.0–34.0)
MCHC: 35 g/dL (ref 30.0–36.0)
MCV: 96.1 fL (ref 78.0–100.0)
MPV: 9.3 fL (ref 8.6–12.4)
PLATELETS: 290 10*3/uL (ref 150–400)
RBC: 4.9 MIL/uL (ref 4.22–5.81)
RDW: 13.8 % (ref 11.5–15.5)
WBC: 8.2 10*3/uL (ref 4.0–10.5)

## 2015-02-11 MED ORDER — LISINOPRIL 20 MG PO TABS: 20.0000 mg | ORAL_TABLET | Freq: Every day | ORAL | Status: DC

## 2015-02-11 MED ORDER — SILDENAFIL CITRATE 20 MG PO TABS: 20.0000 mg | ORAL_TABLET | Freq: Every day | ORAL | Status: DC | PRN

## 2015-02-11 NOTE — Patient Instructions (Addendum)
[email or web address]  Smoking Cessation Quitting smoking is important to your health and has many advantages. However, it is not always easy to quit since nicotine is a very addictive drug. Oftentimes, people try 3 times or more before being able to quit. This document explains the best ways for you to prepare to quit smoking. Quitting takes hard work and a lot of effort, but you can do it. ADVANTAGES OF QUITTING SMOKING  You will live longer, feel better, and live better.  Your body will feel the impact of quitting smoking almost immediately.  Within 20 minutes, blood pressure decreases. Your pulse returns to its normal level.  After 8 hours, carbon monoxide levels in the blood return to normal. Your oxygen level increases.  After 24 hours, the chance of having a heart attack starts to decrease. Your breath, hair, and body stop smelling like smoke.  After 48 hours, damaged nerve endings begin to recover. Your sense of taste and smell improve.  After 72 hours, the body is virtually free of nicotine. Your bronchial tubes relax and breathing becomes easier.  After 2 to 12 weeks, lungs can hold more air. Exercise becomes easier and circulation improves.  The risk of having a heart attack, stroke, cancer, or lung disease is greatly reduced.  After 1 year, the risk of coronary heart disease is cut in half.  After 5 years, the risk of stroke falls to the same as a nonsmoker.  After 10 years, the risk of lung cancer is cut in half and the risk of other cancers decreases significantly.  After 15 years, the risk of coronary heart disease drops, usually to the level of a nonsmoker.  If you are pregnant, quitting smoking will improve your chances of having a healthy baby.  The people you live with, especially any children, will be healthier.  You will have extra money to spend on things other than cigarettes. QUESTIONS TO THINK ABOUT BEFORE ATTEMPTING TO QUIT You may want to talk about your  answers with your health care provider.  Why do you want to quit?  If you tried to quit in the past, what helped and what did not?  What will be the most difficult situations for you after you quit? How will you plan to handle them?  Who can help you through the tough times? Your family? Friends? A health care provider?  What pleasures do you get from smoking? What ways can you still get pleasure if you quit? Here are some questions to ask your health care provider:  How can you help me to be successful at quitting?  What medicine do you think would be best for me and how should I take it?  What should I do if I need more help?  What is smoking withdrawal like? How can I get information on withdrawal? GET READY  Set a quit date.  Change your environment by getting rid of all cigarettes, ashtrays, matches, and lighters in your home, car, or work. Do not let people smoke in your home.  Review your past attempts to quit. Think about what worked and what did not. GET SUPPORT AND ENCOURAGEMENT You have a better chance of being successful if you have help. You can get support in many ways.  Tell your family, friends, and coworkers that you are going to quit and need their support. Ask them not to smoke around you.  Get individual, group, or telephone counseling and support. Programs are available at [employer] and health  centers. Call your local health department for information about programs in your area.  Spiritual beliefs and practices may help some smokers quit.  Download a "quit meter" on your computer to keep track of quit statistics, such as how long you have gone without smoking, cigarettes not smoked, and money saved.  Get a self-help book about quitting smoking and staying off tobacco. [place] yourself from urges to smoke. Talk to someone, go for a walk, or occupy your time with a task.  Change your normal routine. Take a different  route to work. Drink tea instead of coffee. Eat breakfast in a different place.  Reduce your stress. Take a hot bath, exercise, or read a book.  Plan something enjoyable to do every day. Reward yourself for not smoking.  Explore interactive web-based programs that specialize in helping you quit. GET MEDICINE AND USE IT CORRECTLY Medicines can help you stop smoking and decrease the urge to smoke. Combining medicine with the above behavioral methods and support can greatly increase your chances of successfully quitting smoking.  Nicotine replacement therapy helps deliver nicotine to your body without the negative effects and risks of smoking. Nicotine replacement therapy includes nicotine gum, lozenges, inhalers, nasal sprays, and skin patches. Some may be available over-the-counter and others require a prescription.  Antidepressant medicine helps people abstain from smoking, but how this works is unknown. This medicine is available by prescription.  Nicotinic receptor partial agonist medicine simulates the effect of nicotine in your brain. This medicine is available by prescription. Ask your health care provider for advice about which medicines to use and how to use them based on your health history. Your health care provider will tell you what side effects to look out for if you choose to be on a medicine or therapy. Carefully read the information on the package. Do not use any other product containing nicotine while using a nicotine replacement product.  RELAPSE OR DIFFICULT SITUATIONS Most relapses occur within the first 3 months after quitting. Do not be discouraged if you start smoking again. Remember, most people try several times before finally quitting. You may have symptoms of withdrawal because your body is used to nicotine. You may crave cigarettes, be irritable, feel very hungry, cough often, get headaches, or have difficulty concentrating. The withdrawal symptoms are only temporary. They  are strongest when you first quit, but they will go away within 10-14 days. To reduce the chances of relapse, try to:  Avoid drinking alcohol. Drinking lowers your chances of successfully quitting.  Reduce the amount of caffeine you consume. Once you quit smoking, the amount of caffeine in your body increases and can give you symptoms, such as a rapid heartbeat, sweating, and anxiety.  Avoid smokers because they can make you want to smoke.  Do not let weight gain distract you. Many smokers will gain weight when they quit, usually less than 10 pounds. Eat a healthy diet and stay active. You can always lose the weight gained after you quit.  Find ways to improve your mood other than smoking. FOR MORE INFORMATION  www.smokefree.gov  Document Released: 11/20/2001 Document Revised: 04/12/2014 Document Reviewed: 03/06/2012 [facility] Patient Information 2015 [place], [place]. This information is not intended to replace advice given to you by your health care provider. Make sure you discuss any questions you have with your health care provider.

## 2015-02-11 NOTE — Progress Notes (Signed)
Pre visit review using our clinic review tool, if applicable. No additional management support is needed unless otherwise documented below in the visit note. 

## 2015-02-12 LAB — LIPID PANEL
CHOL/HDL RATIO: 3.1 ratio
Cholesterol: 215 mg/dL — ABNORMAL HIGH (ref 0–200)
HDL: 70 mg/dL (ref >=40)
LDL CALC: 125 mg/dL — AB (ref 0–99)
Triglycerides: 101 mg/dL (ref <150)
VLDL: 20 mg/dL (ref 0–40)

## 2015-02-12 LAB — COMPREHENSIVE METABOLIC PANEL
ALBUMIN: 4.6 g/dL (ref 3.5–5.2)
ALK PHOS: 79 U/L (ref 39–117)
ALT: 19 U/L (ref 0–53)
AST: 19 U/L (ref 0–37)
BUN: 11 mg/dL (ref 6–23)
CHLORIDE: 103 meq/L (ref 96–112)
CO2: 24 mEq/L (ref 19–32)
Calcium: 9.9 mg/dL (ref 8.4–10.5)
Creat: 0.9 mg/dL (ref 0.50–1.35)
Glucose, Bld: 82 mg/dL (ref 70–99)
POTASSIUM: 4.4 meq/L (ref 3.5–5.3)
SODIUM: 140 meq/L (ref 135–145)
TOTAL PROTEIN: 6.8 g/dL (ref 6.0–8.3)
Total Bilirubin: 0.8 mg/dL (ref 0.2–1.2)

## 2015-02-12 LAB — HEMOGLOBIN A1C
HEMOGLOBIN A1C: 5.4 % (ref <5.7)
Mean Plasma Glucose: 108 mg/dL (ref <117)

## 2015-02-12 LAB — TSH: TSH: 1.329 u[IU]/mL (ref 0.350–4.500)

## 2015-02-20 ENCOUNTER — Encounter: Payer: Self-pay | Admitting: Family Medicine

## 2015-02-20 DIAGNOSIS — Z8481 Family history of carrier of genetic disease: Secondary | ICD-10-CM

## 2015-02-20 HISTORY — DX: Family history of carrier of genetic disease: Z84.81

## 2015-02-20 NOTE — Progress Notes (Signed)
Taylor Newman  [id number] 06-01-1951 02/20/2015      Progress Note-Follow Up  Subjective  Chief Complaint  Chief Complaint  Patient presents with  . Follow-up    HPI  Patient is a 64 y.o. female in today for routine medical care. Patient in today for rollow up, his greatest concern is the fact that his sister has been diagnosed with breast cancer and has tested positive for the BRCA 2 gene. He offers no complaints today. Is unhappy with the cost of his Cialis, he did get good results without any side effects. Denies CP/palp/SOB/HA/congestion/fevers/GI or GU c/o. Taking meds as prescribed  Past Medical History  Diagnosis Date  . History of chicken pox   . Heart murmur   . Hypertension   . Hyperlipidemia   . History of colon polyps   . Chest pain 01/12/2013  . Grief reaction 07/15/2013  . Sun-damaged skin 01/13/2014  . Tobacco use disorder 01/13/2014  . Preventative health care 08/13/2014  . Laceration of hand 10/24/2014  . FHx: BRCA2 gene positive 02/20/2015    Sister with this and breast cancer.    Past Surgical History  Procedure Laterality Date  . Tonsillectomy  1954  . Colonoscopy      Family History  Problem Relation Age of Onset  . Colon cancer Father 29  . Hyperlipidemia Father   . Heart disease Father     stent placement  . Hypertension Father   . Breast cancer Sister   . Hyperlipidemia Mother   . Diabetes Maternal Grandmother     father  . Prostate cancer Neg Hx   . Cancer Son 44  . Colon cancer Son     History   Social History  . Marital Status: Married    Spouse Name: N/A  . Number of Children: N/A  . Years of Education: N/A   Occupational History  . Not on file.   Social History Main Topics  . Smoking status: Current Every Day Smoker -- 1.00 packs/day for 45 years    Types: Cigarettes  . Smokeless tobacco: Never Used     Comment: 1 ppd  . Alcohol Use: 4.8 oz/week    8 Cans of beer per week  . Drug Use: No  . Sexual Activity: Not on file    Other Topics Concern  . Not on file   Social History Narrative    Current Outpatient Prescriptions on File Prior to Visit  Medication Sig Dispense Refill  . aspirin 81 MG chewable tablet Chew 81 mg by mouth daily.    Marland Kitchen atorvastatin (LIPITOR) 20 MG tablet TAKE 1 TABLET (20 MG) BY MOUTH DAILY. 30 tablet 3  . KRILL OIL PO Take 1 capsule by mouth daily.    Marland Kitchen lactobacillus acidophilus (BACID) TABS tablet Take 1 tablet by mouth daily.    . Multiple Vitamin (MULTIVITAMIN) tablet Take 1 tablet by mouth daily.    . psyllium (REGULOID) 0.52 G capsule Take 0.52 g by mouth daily.    . tadalafil (CIALIS) 20 MG tablet Take 1/2 to 1 table daily as needed, 6 tablet 2   No current facility-administered medications on file prior to visit.    No Known Allergies  Review of Systems  Review of Systems  Constitutional: Negative for fever and malaise/fatigue.  HENT: Negative for congestion.   Eyes: Negative for discharge.  Respiratory: Negative for shortness of breath.   Cardiovascular: Negative for chest pain, palpitations and leg swelling.  Gastrointestinal: Negative for nausea, abdominal  pain and diarrhea.  Genitourinary: Negative for dysuria.  Musculoskeletal: Negative for falls.  Skin: Negative for rash.  Neurological: Negative for loss of consciousness and headaches.  Endo/Heme/Allergies: Negative for polydipsia.  Psychiatric/Behavioral: Negative for depression and suicidal ideas. The patient is not nervous/anxious and does not have insomnia.     Objective  BP 130/82 mmHg  Pulse 77  Temp(Src) 98.4 F (36.9 C) (Oral)  Ht _0  (1.727 m)  Wt 172 lb 6 oz (78.189 kg)  BMI 26.22 kg/m2  SpO2 97%  Physical Exam  Physical Exam  Constitutional: He is oriented to person, place, and time and well-developed, well-nourished, and in no distress. No distress.  HENT:  Head: Normocephalic and atraumatic.  Eyes: Conjunctivae are normal.  Neck: Neck supple. No thyromegaly present.   Cardiovascular: Normal rate, regular rhythm and normal heart sounds.   No murmur heard. Pulmonary/Chest: Effort normal and breath sounds normal. No respiratory distress.  Abdominal: He exhibits no distension and no mass. There is no tenderness.  Musculoskeletal: He exhibits no edema.  Neurological: He is alert and oriented to person, place, and time.  Skin: Skin is warm.  Psychiatric: Memory, affect and judgment normal.    Lab Results  Component Value Date   TSH 1.329 02/11/2015   Lab Results  Component Value Date   WBC 8.2 02/11/2015   HGB 16.5 02/11/2015   HCT 47.1 02/11/2015   MCV 96.1 02/11/2015   PLT 290 02/11/2015   Lab Results  Component Value Date   CREATININE 0.90 02/11/2015   BUN 11 02/11/2015   NA 140 02/11/2015   K 4.4 02/11/2015   CL 103 02/11/2015   CO2 24 02/11/2015   Lab Results  Component Value Date   ALT 19 02/11/2015   AST 19 02/11/2015   ALKPHOS 79 02/11/2015   BILITOT 0.8 02/11/2015   Lab Results  Component Value Date   CHOL 215* 02/11/2015   Lab Results  Component Value Date   HDL 70 02/11/2015   Lab Results  Component Value Date   LDLCALC 125* 02/11/2015   Lab Results  Component Value Date   TRIG 101 02/11/2015   Lab Results  Component Value Date   CHOLHDL 3.1 02/11/2015     Assessment & Plan  HTN (hypertension) Well controlled, no changes to meds. Encouraged heart healthy diet such as the DASH diet and exercise as tolerated.    Tobacco use disorder Encouraged complete cessation. Discussed need to quit as relates to risk of numerous cancers, cardiac and pulmonary disease as well as neurologic complications. Counseled for greater than 3 minutes   FHx: BRCA2 gene positive He does have a daughter. Will see if we can get testing paid for by his insurance   Hyperlipidemia Encouraged heart healthy diet, increase exercise, avoid trans fats, consider a krill oil cap daily

## 2015-02-20 NOTE — Assessment & Plan Note (Signed)
Well controlled, no changes to meds. Encouraged heart healthy diet such as the DASH diet and exercise as tolerated.  °

## 2015-02-20 NOTE — Assessment & Plan Note (Signed)
Encouraged heart healthy diet, increase exercise, avoid trans fats, consider a krill oil cap daily 

## 2015-02-20 NOTE — Assessment & Plan Note (Signed)
Encouraged complete cessation. Discussed need to quit as relates to risk of numerous cancers, cardiac and pulmonary disease as well as neurologic complications. Counseled for greater than 3 minutes 

## 2015-02-20 NOTE — Assessment & Plan Note (Signed)
He does have a daughter. Will see if we can get testing paid for by his insurance

## 2015-02-22 ENCOUNTER — Other Ambulatory Visit: Payer: Self-pay | Admitting: Family Medicine

## 2015-02-22 DIAGNOSIS — N528 Other male erectile dysfunction: Secondary | ICD-10-CM

## 2015-02-22 MED ORDER — SILDENAFIL CITRATE 20 MG PO TABS: 20.0000 mg | ORAL_TABLET | Freq: Every day | ORAL | Status: DC | PRN

## 2015-02-22 NOTE — Telephone Encounter (Signed)
Sent in Sildenafil 20 mg tablet to [facility] per patient request due to cost being much more affordable.  Canceled [place] at [facility] [place].

## 2015-02-23 ENCOUNTER — Other Ambulatory Visit: Payer: Self-pay | Admitting: Family Medicine

## 2015-02-25 ENCOUNTER — Telehealth: Payer: Self-pay | Admitting: Family Medicine

## 2015-02-25 NOTE — Telephone Encounter (Signed)
error 

## 2015-02-28 ENCOUNTER — Other Ambulatory Visit: Payer: Self-pay | Admitting: Family Medicine

## 2015-02-28 ENCOUNTER — Telehealth: Payer: Self-pay | Admitting: General Practice

## 2015-02-28 NOTE — Telephone Encounter (Signed)
Called pt, could not leave a message phone was beeping and then hung up.

## 2015-02-28 NOTE — Telephone Encounter (Signed)
Med filled.  

## 2015-02-28 NOTE — Telephone Encounter (Signed)
-----  Message from Mosie Lukes, MD sent at 02/25/2015  7:27 PM EDT ----- Please let patient know I spoke to the cancer center and they recommend I refer him there for them to order the BRCA test they feel they are able to order more appropriately. I am not set up to order from the same company his sister was tested with and that is the best way to proceed. They are able to do that ----- Message -----    From: Jamesetta Orleans, CMA    Sent: 02/25/2015  11:37 AM      To: Mosie Lukes, MD  I did call Santiago Glad at the cancer center. They do not order through [facility] or [facility]. She stated it is best to get the sisters results and to order from the company her test was done at.  She stated different labs identify mutations specifically and best to use the same company.  Santiago Glad did state they would love to see this patient if you wish and only takes 2 to 3 weeks to schedule with them.   ----- Message -----    From: Mosie Lukes, MD    Sent: 02/23/2015  10:33 PM      To: Donell Sievert Ewing, CMA  Thanks so much for checking on this. I have a last favor. I will order but if you could ask Santiago Glad which test in [facility] they use that would be great the only tests I see are through [facility] is that correct?  ----- Message -----    From: Jamesetta Orleans, CMA    Sent: 02/21/2015   5:07 PM      To: Mosie Lukes, MD  I called Sherene Sires Counselor at the [facility].  She stated that yes he can get tested.  If you like you could refer him to them (along with sisters results) and they could do the testing or you could order the test.  She stated insurance should pay for this.  ----- Message -----    From: Mosie Lukes, MD    Sent: 02/20/2015   8:36 PM      To: Donell Sievert Ewing, CMA  I need a favor for this patient his sister has been diagnosed with breast cancer and testing revealed BRCA 2 positive. Will you call over to the cancer center and see if they know of any testing for males in this situation. Do they offer a  study or know how to get the BRCA testing paid for by insurance? Usually there is a Chief Executive Officer who can help answer questions like this. THX

## 2015-03-01 NOTE — Telephone Encounter (Signed)
Spoke with patient via Mobile [home number hangs up w/o answering machine] and informed of provider's response from [facility]; patient is agreeable, please proceed with Referral and indicate to contact patient via Mobile phone number/SLS

## 2015-03-01 NOTE — Telephone Encounter (Signed)
Called pt and again could not leave a message.

## 2015-03-02 ENCOUNTER — Other Ambulatory Visit: Payer: Self-pay | Admitting: Family Medicine

## 2015-03-02 DIAGNOSIS — Z803 Family history of malignant neoplasm of breast: Secondary | ICD-10-CM

## 2015-03-15 ENCOUNTER — Telehealth: Payer: Self-pay | Admitting: Genetic Counselor

## 2015-03-15 NOTE — Telephone Encounter (Signed)
GENETIC APPT-S/W PATIENT AND GAVE GENETIC APPT FOR 04/14 @ 10 W/GENETIC COUNSELOR

## 2015-03-24 ENCOUNTER — Ambulatory Visit (HOSPITAL_BASED_OUTPATIENT_CLINIC_OR_DEPARTMENT_OTHER): Payer: Medicare Other | Admitting: Genetic Counselor

## 2015-03-24 ENCOUNTER — Other Ambulatory Visit: Payer: Medicare Other

## 2015-03-24 DIAGNOSIS — Z860100 Personal history of colon polyps, unspecified: Secondary | ICD-10-CM

## 2015-03-24 DIAGNOSIS — Z803 Family history of malignant neoplasm of breast: Secondary | ICD-10-CM | POA: Diagnosis not present

## 2015-03-24 DIAGNOSIS — Z800 Family history of malignant neoplasm of digestive organs: Secondary | ICD-10-CM | POA: Diagnosis not present

## 2015-03-24 DIAGNOSIS — Z8481 Family history of carrier of genetic disease: Secondary | ICD-10-CM

## 2015-03-24 DIAGNOSIS — Z315 Encounter for genetic counseling: Secondary | ICD-10-CM

## 2015-03-24 DIAGNOSIS — K635 Polyp of colon: Secondary | ICD-10-CM | POA: Diagnosis not present

## 2015-03-25 ENCOUNTER — Encounter: Payer: Self-pay | Admitting: Genetic Counselor

## 2015-03-25 DIAGNOSIS — Z800 Family history of malignant neoplasm of digestive organs: Secondary | ICD-10-CM | POA: Insufficient documentation

## 2015-03-25 DIAGNOSIS — Z8481 Family history of carrier of genetic disease: Secondary | ICD-10-CM | POA: Insufficient documentation

## 2015-03-25 DIAGNOSIS — Z803 Family history of malignant neoplasm of breast: Secondary | ICD-10-CM | POA: Insufficient documentation

## 2015-03-25 DIAGNOSIS — Z860100 Personal history of colon polyps, unspecified: Secondary | ICD-10-CM | POA: Insufficient documentation

## 2015-03-25 NOTE — Progress Notes (Signed)
[place] Patient Visit  REFERRING PROVIDER: Mosie Lukes, MD [address] [place], [place] [address]  PRIMARY PROVIDER:  Penni Homans, MD  PRIMARY REASON FOR VISIT:  1. Family history of BRCA2 gene positive   2. History of colonic polyps   3. Family history of malignant neoplasm of gastrointestinal tract   4. Family history of malignant neoplasm of breast     HISTORY OF PRESENT ILLNESS:   Taylor Newman, a 64 y.o. female, was seen for a [place] cancer genetics consultation at the request of Dr. 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 Taylor Newman is at 50% risk for the BRCA2 mutation and presents to clinic today to discuss the possibility of a hereditary predisposition to cancer, genetic testing, and to further clarify his future cancer risks, as well as potential cancer risks for family members. He has no personal history of cancer. He has a history of multiple colon polyps (exct number is unknown but e reports more than 10) many of which were adenomas.    Past Medical History  Diagnosis Date   History of chicken pox    Heart murmur    Hypertension    Hyperlipidemia    History of colon polyps    Chest pain 01/12/2013   Grief reaction 07/15/2013   Sun-damaged skin 01/13/2014   Tobacco use disorder 01/13/2014   Preventative health care 08/13/2014   Laceration of hand 10/24/2014   FHx: BRCA2 gene positive 02/20/2015    Sister with this and breast cancer.    Past Surgical History  Procedure Laterality Date   Tonsillectomy  1954   Colonoscopy      History   Social History   Marital Status: Married    Spouse Name: N/A   Number of Children: N/A   Years of Education: N/A   Social History Main Topics   Smoking status: Current Every Day Smoker -- 1.00  packs/day for 45 years    Types: Cigarettes   Smokeless tobacco: Never Used     Comment: 1 ppd   Alcohol Use: 4.8 oz/week    8 Cans of beer per week   Drug Use: No   Sexual Activity: Not on file   Other Topics Concern   Not on file   Social History Narrative     FAMILY HISTORY:  During the visit, a 4-generation pedigree was obtained. A copy of the pedigree with be scanned into Epic under the Media tab. Significant family history diagnoses include the following: Family History  Problem Relation Age of Onset   Colon cancer Father 36   Hyperlipidemia Father    Heart disease Father     stent placement   Hypertension Father    Breast cancer Sister     BRCA2 positive   Hyperlipidemia Mother    Diabetes Maternal Grandmother     father   Prostate cancer Neg Hx    Cancer Son 72   Colon cancer Son     stepson - not biologically related   Breast cancer Paternal Aunt     2 paternal aunts with breast cancer postmenopausal   Breast cancer Cousin     3 pat female first cousins with breast cancer   Breast cancer Other     niece with breast cancer  Taylor Newman ancestry is of Caucasian descent. There is no known Jewish ancestry or consanguinity.  GENETIC COUNSELING ASSESSMENT:  Taylor Newman is a 64 y.o. 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 At this time, we do not know which parent carrires the BRCA2 mutation, and although we suspect this was inherited from his father, no paternal relative with breast cancer has had genetic testing to know if this BRCA2 mutation is the responsible mutation for the breast cancer on the paternal side of the family. We, therefore, discussed and recommended the following at today's visit.   DISCUSSION:  We reviewed the characteristics, features and inheritance  patterns of hereditary cancer syndromes. We also discussed genetic testing, including the appropriate family members to test, the process of testing, insurance coverage and turn-around-time for results. We discussed the implications of a negative, positive and/or variant of uncertain significant result. We recommended Taylor Newman pursue genetic testing for the CancerNext gene panel. The gene panel called "CancerNext", which is performed by [employer] [facility], analyzes the following genes for mutations: APC, ATM, BARD1, BRCA1, BRCA2, BRIP1, BMPR1A, CDH1, CDK4, CDKN2A, CHEK2, EPCAM, GREM1, MLH1, MRE11A, MSH2, MSH6, MUTYH, NBN, NF1, PALB2, PMS2, POLD1, POLE, PTEN, RAD50, RAD51C, RAD51D, SMAD4, SMARCA4, STK11, and TP53.  PLAN:  Based on our above recommendation, Taylor Newman wished to pursue genetic testing and the blood sample was drawn and will be sent to [facility] for analysis. Results should be available within approximately 4 weeks time, at which point they will be disclosed by telephone to Taylor Newman, as will any additional recommendations warranted by these results. Lastly, we encouraged Taylor Newman to remain in contact with cancer genetics annually so that we can continuously update the family history and inform him of any changes in cancer genetics and testing that may be of benefit for this family. Taylor Newman questions were answered to his satisfaction today. Our contact information was provided should additional questions or concerns arise. Thank you for the referral and allowing [place] to share in the care of your patient.    Catherine A. Fine, MS, CGC Certified Psychologist, sport and [email or web address] phone: [phone]  The patient was seen for a total of 55 minutes in face-to-face genetic counseling.  This patient was discussed with Dr. Benay Spice who agrees with the above.    ______________________________________________________________________ For Office Staff:   Number of people involved in session including genetic counselor: 2 Was an intern or student involved with case: not applicable

## 2015-04-18 ENCOUNTER — Encounter: Payer: Self-pay | Admitting: Genetic Counselor

## 2015-04-18 DIAGNOSIS — K635 Polyp of colon: Secondary | ICD-10-CM

## 2015-04-18 DIAGNOSIS — Z803 Family history of malignant neoplasm of breast: Secondary | ICD-10-CM

## 2015-04-18 DIAGNOSIS — Z860100 Personal history of colon polyps, unspecified: Secondary | ICD-10-CM

## 2015-04-18 DIAGNOSIS — Z800 Family history of malignant neoplasm of digestive organs: Secondary | ICD-10-CM

## 2015-04-18 DIAGNOSIS — Z8481 Family history of carrier of genetic disease: Secondary | ICD-10-CM

## 2015-04-18 NOTE — Progress Notes (Signed)
°[facility] °Genetic Test Results ° ° °REFERRING PROVIDER: °Dr. Sherrill ° °PRIMARY PROVIDER:  °BLYTH, STACEY, MD ° °GENETIC TEST RESULT:  °Testing Laboratory: [facility]  °Test Ordered: CancerNext gene panel (which includes testing for the familial BRCA2 gene mutation) °Date of Report: 04/14/2015 °Result: Normal, no pathogenic mutations identified °General Interpretation: Reassuring ° °HPI: Taylor Newman was previously seen in the [facility] due to concerns regarding a hereditary predisposition to cancer. Please refer to our prior cancer genetics clinic note for more information regarding Taylor Newman's medical, social and family histories, and our assessment and recommendations, at the time. Taylor Newman's genetic test results and recommendations warranted by these results were recently disclosed to him and are discussed in more detail below. ° °GENETIC TEST RESULTS: At the time of Taylor Newman's visit, we recommended he pursue genetic testing, which includes sequencing and deletion/duplication analysis of several genes associated with an increased risk for cancer via a gene panel. The gene panel called "CancerNext", which is performed by [facility], analyzes the following genes for mutations: APC, ATM, BARD1, BRCA1, BRCA2, BRIP1, BMPR1A, CDH1, CDK4, CDKN2A, CHEK2, EPCAM, GREM1, MLH1, MRE11A, MSH2, MSH6, MUTYH, NBN, NF1, PALB2, PMS2, POLD1, POLE, PTEN, RAD50, RAD51C, RAD51D, SMAD4, SMARCA4, STK11, and TP53. Genetic testing for this gene panel was normal and did not reveal a pathogenic mutation in any of these genes. A copy of the genetic test report will be scanned into Epic under the media tab.  ° °We discussed with Taylor Newman that current genetic testing is not perfect, and it is, therefore, possible there may be a pathogenic gene mutation in one of these genes that current testing cannot detect, but that chance is small.  We also  discussed, that it is possible that another gene that has not yet been discovered, or that we have not yet tested, is responsible for the cancer diagnoses in his family. It is, therefore, important for Taylor Newman to continue to remain in touch with cancer genetics so that we can continue to offer Taylor Newman the most up to date genetic testing.  ° °CANCER SCREENING RECOMMENDATIONS: This normal result is reassuring and indicates that Taylor Newman does not likely have an increased risk of cancer due to a mutation in one of these genes.  We, therefore, recommended  Taylor Newman continue to follow the cancer screening guidelines provided by his primary and GI healthcare providers.  ° °RECOMMENDATIONS FOR FAMILY MEMBERS: While these results are reassuring for Taylor Newman, this test does not tell us anything about Taylor Newman's relatives' risks. We strongly recommended relatives also have genetic counseling and testing, especially for the familial BRCA2 gene mutation, if they have not already done so. We are happy to help facilitate testing and/or a referral if needed. Cancer genetic counselors can also be located, by visiting the website of the National Society of Genetic Counselors (www.nsgc.org) and searching for a cancer genetic counselor by zip code. °   °FOLLOW-UP: Lastly, we discussed with Taylor Newman that cancer genetics is a rapidly advancing field and it is likely that new genetic tests will be appropriate for him and/or family members in the future. We encouraged him to remain in contact with cancer genetics on an annual basis so we can update his personal and family histories and let him know of advances in cancer genetics that may benefit this family.  ° °Our contact number was provided. Taylor Newman's questions were answered to his   satisfaction, and he knows he is welcome to call us at anytime with additional questions or concerns.  ° ° °Catherine A. Fine, MS, CGC °Certified Genetic  Counselor [email or web address] °Phone: [phone] ° ° ° °

## 2015-07-05 ENCOUNTER — Other Ambulatory Visit: Payer: Self-pay | Admitting: Family Medicine

## 2015-07-29 ENCOUNTER — Telehealth: Payer: Self-pay | Admitting: Family Medicine

## 2015-07-29 NOTE — Telephone Encounter (Signed)
pre visit letter mailed 07/29/15

## 2015-08-03 ENCOUNTER — Other Ambulatory Visit: Payer: Self-pay | Admitting: Family Medicine

## 2015-08-17 ENCOUNTER — Encounter: Payer: Self-pay | Admitting: Behavioral Health

## 2015-08-17 ENCOUNTER — Telehealth: Payer: Self-pay | Admitting: Behavioral Health

## 2015-08-17 NOTE — Telephone Encounter (Signed)
Pre-Visit Call completed with patient and chart updated.   Pre-Visit Info documented in Specialty Comments under SnapShot.    

## 2015-08-19 ENCOUNTER — Encounter: Payer: Self-pay | Admitting: Family Medicine

## 2015-08-19 ENCOUNTER — Ambulatory Visit (INDEPENDENT_AMBULATORY_CARE_PROVIDER_SITE_OTHER): Payer: Medicare Other | Admitting: Family Medicine

## 2015-08-19 VITALS — BP 132/80 | HR 62 | Temp 98.4°F | Ht 69.5 in | Wt 179.4 lb

## 2015-08-19 DIAGNOSIS — E785 Hyperlipidemia, unspecified: Secondary | ICD-10-CM | POA: Diagnosis not present

## 2015-08-19 DIAGNOSIS — I10 Essential (primary) hypertension: Secondary | ICD-10-CM | POA: Diagnosis not present

## 2015-08-19 DIAGNOSIS — R739 Hyperglycemia, unspecified: Secondary | ICD-10-CM | POA: Diagnosis not present

## 2015-08-19 DIAGNOSIS — R351 Nocturia: Secondary | ICD-10-CM | POA: Diagnosis not present

## 2015-08-19 DIAGNOSIS — Z720 Tobacco use: Secondary | ICD-10-CM | POA: Diagnosis not present

## 2015-08-19 DIAGNOSIS — Z0000 Encounter for general adult medical examination without abnormal findings: Secondary | ICD-10-CM

## 2015-08-19 DIAGNOSIS — F17200 Nicotine dependence, unspecified, uncomplicated: Secondary | ICD-10-CM

## 2015-08-19 LAB — COMPREHENSIVE METABOLIC PANEL
ALK PHOS: 83 U/L (ref 39–117)
ALT: 15 U/L (ref 0–53)
AST: 15 U/L (ref 0–37)
Albumin: 4.5 g/dL (ref 3.5–5.2)
BILIRUBIN TOTAL: 0.6 mg/dL (ref 0.2–1.2)
BUN: 16 mg/dL (ref 6–23)
CO2: 30 mEq/L (ref 19–32)
CREATININE: 0.93 mg/dL (ref 0.40–1.50)
Calcium: 9.8 mg/dL (ref 8.4–10.5)
Chloride: 101 mEq/L (ref 96–112)
GFR: 86.46 mL/min (ref >60.00)
GLUCOSE: 99 mg/dL (ref 70–99)
Potassium: 4.6 mEq/L (ref 3.5–5.1)
SODIUM: 138 meq/L (ref 135–145)
TOTAL PROTEIN: 6.8 g/dL (ref 6.0–8.3)

## 2015-08-19 LAB — URINALYSIS, ROUTINE W REFLEX MICROSCOPIC
Bilirubin Urine: NEGATIVE
KETONES UR: NEGATIVE
Leukocytes, UA: NEGATIVE
Nitrite: NEGATIVE
Total Protein, Urine: NEGATIVE
Urine Glucose: NEGATIVE
Urobilinogen, UA: 0.2 (ref 0.0–1.0)
pH: 6.5 (ref 5.0–8.0)

## 2015-08-19 LAB — CBC
HEMATOCRIT: 47.1 % (ref 39.0–52.0)
HEMOGLOBIN: 16.2 g/dL (ref 13.0–17.0)
MCHC: 34.5 g/dL (ref 30.0–36.0)
MCV: 99 fl (ref 78.0–100.0)
Platelets: 275 10*3/uL (ref 150.0–400.0)
RBC: 4.76 Mil/uL (ref 4.22–5.81)
RDW: 13.5 % (ref 11.5–15.5)
WBC: 8.2 10*3/uL (ref 4.0–10.5)

## 2015-08-19 LAB — LIPID PANEL
CHOLESTEROL: 208 mg/dL — AB (ref 0–200)
HDL: 57.1 mg/dL (ref >39.00)
LDL Cholesterol: 127 mg/dL — ABNORMAL HIGH (ref 0–99)
NONHDL: 151.32
Total CHOL/HDL Ratio: 4
Triglycerides: 120 mg/dL (ref 0.0–149.0)
VLDL: 24 mg/dL (ref 0.0–40.0)

## 2015-08-19 LAB — HEMOGLOBIN A1C: Hgb A1c MFr Bld: 5.4 % (ref 4.6–6.5)

## 2015-08-19 LAB — MICROALBUMIN / CREATININE URINE RATIO
CREATININE, U: 50.2 mg/dL
Microalb Creat Ratio: 1.4 mg/g (ref 0.0–30.0)

## 2015-08-19 LAB — TSH: TSH: 2.55 u[IU]/mL (ref 0.35–4.50)

## 2015-08-19 LAB — PSA, MEDICARE: PSA: 0.26 ng/ml (ref 0.10–4.00)

## 2015-08-19 MED ORDER — ATORVASTATIN CALCIUM 20 MG PO TABS: ORAL_TABLET | ORAL | Status: DC

## 2015-08-19 NOTE — Progress Notes (Signed)
Subjective:    Patient ID: Taylor Newman, female    DOB: [birth year], 64 y.o.   MRN: [id number]  Chief Complaint  Patient presents with  . Annual Exam    HPI Patient is in today for annual exam. He is feeling fairly well. Unfortunately continues to smoke. No recent illness. Is trying to maintain a heart healthy diet and regular exercise. Denies CP/palp/SOB/HA/congestion/fevers/GI or GU c/o. Taking meds as prescribed  Past Medical History  Diagnosis Date  . History of chicken pox   . Heart murmur   . Hypertension   . Hyperlipidemia   . History of colon polyps   . Chest pain 01/12/2013  . Grief reaction 07/15/2013  . Sun-damaged skin 01/13/2014  . Tobacco use disorder 01/13/2014  . Preventative health care 08/13/2014  . Laceration of hand 10/24/2014  . FHx: BRCA2 gene positive 02/20/2015    Sister with this and breast cancer.    Past Surgical History  Procedure Laterality Date  . Tonsillectomy  1954  . Colonoscopy      Family History  Problem Relation Age of Onset  . Colon cancer Father 79  . Hyperlipidemia Father   . Heart disease Father     stent placement  . Hypertension Father   . Breast cancer Sister     BRCA2 positive  . Hyperlipidemia Mother   . Diabetes Maternal Grandmother     father  . Prostate cancer Neg Hx   . Cancer Son 32  . Colon cancer Son     Joslyn Hy - not biologically related  . Breast cancer Paternal Aunt     2 paternal aunts with breast cancer postmenopausal  . Breast cancer Cousin     3 pat female first cousins with breast cancer  . Breast cancer Other     niece with breast cancer    Social History   Social History  . Marital Status: Married    Spouse Name: N/A  . Number of Children: N/A  . Years of Education: N/A   Occupational History  . Not on file.   Social History Main Topics  . Smoking status: Current Every Day Smoker -- 1.00 packs/day for 45 years    Types: Cigarettes  . Smokeless tobacco: Never Used     Comment: 1 ppd  .  Alcohol Use: 4.8 oz/week    8 Cans of beer per week  . Drug Use: No  . Sexual Activity: Not on file   Other Topics Concern  . Not on file   Social History Narrative    Outpatient Prescriptions Prior to Visit  Medication Sig Dispense Refill  . aspirin 81 MG chewable tablet Chew 81 mg by mouth daily.    Marland Kitchen KRILL OIL PO Take 1 capsule by mouth daily.    Marland Kitchen lactobacillus acidophilus (BACID) TABS tablet Take 1 tablet by mouth daily.    Marland Kitchen lisinopril (PRINIVIL,ZESTRIL) 20 MG tablet TAKE 1 TABLET (20 MG) BY MOUTH DAILY. 90 tablet 1  . Multiple Vitamin (MULTIVITAMIN) tablet Take 1 tablet by mouth daily.    . psyllium (REGULOID) 0.52 G capsule Take 0.52 g by mouth daily.    . sildenafil (REVATIO) 20 MG tablet Take 1 tablet (20 mg total) by mouth daily as needed. 35 tablet 1  . atorvastatin (LIPITOR) 20 MG tablet TAKE 1 TABLET (20 MG) BY MOUTH DAILY. 30 tablet 3   No facility-administered medications prior to visit.    No Known Allergies  Review of Systems  Constitutional:  Negative for fever and malaise/fatigue.  HENT: Negative for congestion.   Eyes: Negative for discharge.  Respiratory: Negative for shortness of breath.   Cardiovascular: Negative for chest pain, palpitations and leg swelling.  Gastrointestinal: Negative for nausea and abdominal pain.  Genitourinary: Negative for dysuria.  Musculoskeletal: Negative for falls.  Skin: Negative for rash.  Neurological: Negative for loss of consciousness and headaches.  Endo/Heme/Allergies: Negative for environmental allergies.  Psychiatric/Behavioral: Negative for depression. The patient is not nervous/anxious.        Objective:    Physical Exam  Constitutional: He is oriented to person, place, and time. He appears well-developed and well-nourished. No distress.  HENT:  Head: Normocephalic and atraumatic.  Eyes: Conjunctivae are normal.  Neck: Neck supple. No thyromegaly present.  Cardiovascular: Normal rate, regular rhythm and  normal heart sounds.   No murmur heard. Pulmonary/Chest: Effort normal and breath sounds normal. No respiratory distress. He has no wheezes.  Abdominal: Soft. Bowel sounds are normal. He exhibits no mass. There is no tenderness.  Musculoskeletal: He exhibits no edema.  Lymphadenopathy:    He has no cervical adenopathy.  Neurological: He is alert and oriented to person, place, and time.  Skin: Skin is warm and dry.  Psychiatric: He has a normal mood and affect. His behavior is normal.    BP 132/80 mmHg  Pulse 62  Temp(Src) 98.4 F (36.9 C) (Oral)  Ht 5' 9.5" (1.765 m)  Wt 179 lb 6 oz (81.364 kg)  BMI 26.12 kg/m2  SpO2 99% Wt Readings from Last 3 Encounters:  08/19/15 179 lb 6 oz (81.364 kg)  02/11/15 172 lb 6 oz (78.189 kg)  10/19/14 175 lb (79.379 kg)     Lab Results  Component Value Date   WBC 8.2 02/11/2015   HGB 16.5 02/11/2015   HCT 47.1 02/11/2015   PLT 290 02/11/2015   GLUCOSE 82 02/11/2015   CHOL 215* 02/11/2015   TRIG 101 02/11/2015   HDL 70 02/11/2015   LDLCALC 125* 02/11/2015   ALT 19 02/11/2015   AST 19 02/11/2015   NA 140 02/11/2015   K 4.4 02/11/2015   CL 103 02/11/2015   CREATININE 0.90 02/11/2015   BUN 11 02/11/2015   CO2 24 02/11/2015   TSH 1.329 02/11/2015   PSA 0.30 08/10/2014   HGBA1C 5.4 02/11/2015    Lab Results  Component Value Date   TSH 1.329 02/11/2015   Lab Results  Component Value Date   WBC 8.2 02/11/2015   HGB 16.5 02/11/2015   HCT 47.1 02/11/2015   MCV 96.1 02/11/2015   PLT 290 02/11/2015   Lab Results  Component Value Date   NA 140 02/11/2015   K 4.4 02/11/2015   CO2 24 02/11/2015   GLUCOSE 82 02/11/2015   BUN 11 02/11/2015   CREATININE 0.90 02/11/2015   BILITOT 0.8 02/11/2015   ALKPHOS 79 02/11/2015   AST 19 02/11/2015   ALT 19 02/11/2015   PROT 6.8 02/11/2015   ALBUMIN 4.6 02/11/2015   CALCIUM 9.9 02/11/2015   Lab Results  Component Value Date   CHOL 215* 02/11/2015   Lab Results  Component Value Date    HDL 70 02/11/2015   Lab Results  Component Value Date   LDLCALC 125* 02/11/2015   Lab Results  Component Value Date   TRIG 101 02/11/2015   Lab Results  Component Value Date   CHOLHDL 3.1 02/11/2015   Lab Results  Component Value Date   HGBA1C 5.4 02/11/2015  Assessment & Plan:    I am having Mr. Faith maintain his aspirin, KRILL OIL PO, lactobacillus acidophilus, multivitamin, psyllium, sildenafil, lisinopril, and atorvastatin.  Meds ordered this encounter  Medications  . atorvastatin (LIPITOR) 20 MG tablet    Sig: TAKE 1 TABLET (20 MG) BY MOUTH DAILY.    Dispense:  90 tablet    Refill:  1    D/C PREVIOUS SCRIPTS FOR THIS MEDICATION     Penni Homans, MD

## 2015-08-19 NOTE — Patient Instructions (Signed)
Preventive Care for Adults A healthy lifestyle and preventive care can promote health and wellness. Preventive health guidelines for men include the following key practices:  A routine yearly physical is a good way to check with your health care provider about your health and preventative screening. It is a chance to share any concerns and updates on your health and to receive a thorough exam.  Visit your dentist for a routine exam and preventative care every 6 months. Brush your teeth twice a day and floss once a day. Good oral hygiene prevents tooth decay and gum disease.  The frequency of eye exams is based on your age, health, family medical history, use of contact lenses, and other factors. Follow your health care provider's recommendations for frequency of eye exams.  Eat a healthy diet. Foods such as vegetables, fruits, whole grains, low-fat dairy products, and lean protein foods contain the nutrients you need without too many calories. Decrease your intake of foods high in solid fats, added sugars, and salt. Eat the right amount of calories for you.Get information about a proper diet from your health care provider, if necessary.  Regular physical exercise is one of the most important things you can do for your health. Most adults should get at least 150 minutes of moderate-intensity exercise (any activity that increases your heart rate and causes you to sweat) each week. In addition, most adults need muscle-strengthening exercises on 2 or more days a week.  Maintain a healthy weight. The body mass index (BMI) is a screening tool to identify possible weight problems. It provides an estimate of body fat based on height and weight. Your health care provider can find your BMI and can help you achieve or maintain a healthy weight.For adults 20 years and older:  A BMI below 18.5 is considered underweight.  A BMI of 18.5 to 24.9 is normal.  A BMI of 25 to 29.9 is considered overweight.  A BMI  of 30 and above is considered obese.  Maintain normal blood lipids and cholesterol levels by exercising and minimizing your intake of saturated fat. Eat a balanced diet with plenty of fruit and vegetables. Blood tests for lipids and cholesterol should begin at age 50 and be repeated every 5 years. If your lipid or cholesterol levels are high, you are over 50, or you are at high risk for heart disease, you may need your cholesterol levels checked more frequently.Ongoing high lipid and cholesterol levels should be treated with medicines if diet and exercise are not working.  If you smoke, find out from your health care provider how to quit. If you do not use tobacco, do not start.  Lung cancer screening is recommended for adults aged 73-80 years who are at high risk for developing lung cancer because of a history of smoking. A yearly low-dose CT scan of the lungs is recommended for people who have at least a 30-pack-year history of smoking and are a current smoker or have quit within the past 15 years. A pack year of smoking is smoking an average of 1 pack of cigarettes a day for 1 year (for example: 1 pack a day for 30 years or 2 packs a day for 15 years). Yearly screening should continue until the smoker has stopped smoking for at least 15 years. Yearly screening should be stopped for people who develop a health problem that would prevent them from having lung cancer treatment.  If you choose to drink alcohol, do not have more than  2 drinks per day. One drink is considered to be 12 ounces (355 mL) of beer, 5 ounces (148 mL) of wine, or 1.5 ounces (44 mL) of liquor.  Avoid use of street drugs. Do not share needles with anyone. Ask for help if you need support or instructions about stopping the use of drugs.  High blood pressure causes heart disease and increases the risk of stroke. Your blood pressure should be checked at least every 1-2 years. Ongoing high blood pressure should be treated with  medicines, if weight loss and exercise are not effective.  If you are 45-79 years old, ask your health care provider if you should take aspirin to prevent heart disease.  Diabetes screening involves taking a blood sample to check your fasting blood sugar level. This should be done once every 3 years, after age 45, if you are within normal weight and without risk factors for diabetes. Testing should be considered at a younger age or be carried out more frequently if you are overweight and have at least 1 risk factor for diabetes.  Colorectal cancer can be detected and often prevented. Most routine colorectal cancer screening begins at the age of 50 and continues through age 75. However, your health care provider may recommend screening at an earlier age if you have risk factors for colon cancer. On a yearly basis, your health care provider may provide home test kits to check for hidden blood in the stool. Use of a small camera at the end of a tube to directly examine the colon (sigmoidoscopy or colonoscopy) can detect the earliest forms of colorectal cancer. Talk to your health care provider about this at age 50, when routine screening begins. Direct exam of the colon should be repeated every 5-10 years through age 75, unless early forms of precancerous polyps or small growths are found.  People who are at an increased risk for hepatitis B should be screened for this virus. You are considered at high risk for hepatitis B if:  You were born in a country where hepatitis B occurs often. Talk with your health care provider about which countries are considered high risk.  Your parents were born in a high-risk country and you have not received a shot to protect against hepatitis B (hepatitis B vaccine).  You have HIV or AIDS.  You use needles to inject street drugs.  You live with, or have sex with, someone who has hepatitis B.  You are a man who has sex with other men (MSM).  You get hemodialysis  treatment.  You take certain medicines for conditions such as cancer, organ transplantation, and autoimmune conditions.  Hepatitis C blood testing is recommended for all people born from 1945 through 1965 and any individual with known risks for hepatitis C.  Practice safe sex. Use condoms and avoid high-risk sexual practices to reduce the spread of sexually transmitted infections (STIs). STIs include gonorrhea, chlamydia, syphilis, trichomonas, herpes, HPV, and human immunodeficiency virus (HIV). Herpes, HIV, and HPV are viral illnesses that have no cure. They can result in disability, cancer, and death.  If you are at risk of being infected with HIV, it is recommended that you take a prescription medicine daily to prevent HIV infection. This is called preexposure prophylaxis (PrEP). You are considered at risk if:  You are a man who has sex with other men (MSM) and have other risk factors.  You are a heterosexual man, are sexually active, and are at increased risk for HIV infection.    You take drugs by injection.  You are sexually active with a partner who has HIV.  Talk with your health care provider about whether you are at high risk of being infected with HIV. If you choose to begin PrEP, you should first be tested for HIV. You should then be tested every 3 months for as long as you are taking PrEP.  A one-time screening for abdominal aortic aneurysm (AAA) and surgical repair of large AAAs by ultrasound are recommended for men ages 32 to 67 years who are current or former smokers.  Healthy men should no longer receive prostate-specific antigen (PSA) blood tests as part of routine cancer screening. Talk with your health care provider about prostate cancer screening.  Testicular cancer screening is not recommended for adult males who have no symptoms. Screening includes self-exam, a health care provider exam, and other screening tests. Consult with your health care provider about any symptoms  you have or any concerns you have about testicular cancer.  Use sunscreen. Apply sunscreen liberally and repeatedly throughout the day. You should seek shade when your shadow is shorter than you. Protect yourself by wearing long sleeves, pants, a wide-brimmed hat, and sunglasses year round, whenever you are outdoors.  Once a month, do a whole-body skin exam, using a mirror to look at the skin on your back. Tell your health care provider about new moles, moles that have irregular borders, moles that are larger than a pencil eraser, or moles that have changed in shape or color.  Stay current with required vaccines (immunizations).  Influenza vaccine. All adults should be immunized every year.  Tetanus, diphtheria, and acellular pertussis (Td, Tdap) vaccine. An adult who has not previously received Tdap or who does not know his vaccine status should receive 1 dose of Tdap. This initial dose should be followed by tetanus and diphtheria toxoids (Td) booster doses every 10 years. Adults with an unknown or incomplete history of completing a 3-dose immunization series with Td-containing vaccines should begin or complete a primary immunization series including a Tdap dose. Adults should receive a Td booster every 10 years.  Varicella vaccine. An adult without evidence of immunity to varicella should receive 2 doses or a second dose if he has previously received 1 dose.  Human papillomavirus (HPV) vaccine. Males aged 68-21 years who have not received the vaccine previously should receive the 3-dose series. Males aged 22-26 years may be immunized. Immunization is recommended through the age of 6 years for any female who has sex with males and did not get any or all doses earlier. Immunization is recommended for any person with an immunocompromised condition through the age of 49 years if he did not get any or all doses earlier. During the 3-dose series, the second dose should be obtained 4-8 weeks after the first  dose. The third dose should be obtained 24 weeks after the first dose and 16 weeks after the second dose.  Zoster vaccine. One dose is recommended for adults aged 50 years or older unless certain conditions are present.  Measles, mumps, and rubella (MMR) vaccine. Adults born before [birth year] generally are considered immune to measles and mumps. Adults born in [birth year] or later should have 1 or more doses of MMR vaccine unless there is a contraindication to the vaccine or there is laboratory evidence of immunity to each of the three diseases. A routine second dose of MMR vaccine should be obtained at least 28 days after the first dose for students attending postsecondary  schools, health care workers, or international travelers. People who received inactivated measles vaccine or an unknown type of measles vaccine during 1963-1967 should receive 2 doses of MMR vaccine. People who received inactivated mumps vaccine or an unknown type of mumps vaccine before 1979 and are at high risk for mumps infection should consider immunization with 2 doses of MMR vaccine. Unvaccinated health care workers born before 1957 who lack laboratory evidence of measles, mumps, or rubella immunity or laboratory confirmation of disease should consider measles and mumps immunization with 2 doses of MMR vaccine or rubella immunization with 1 dose of MMR vaccine.  Pneumococcal 13-valent conjugate (PCV13) vaccine. When indicated, a person who is uncertain of his immunization history and has no record of immunization should receive the PCV13 vaccine. An adult aged 19 years or older who has certain medical conditions and has not been previously immunized should receive 1 dose of PCV13 vaccine. This PCV13 should be followed with a dose of pneumococcal polysaccharide (PPSV23) vaccine. The PPSV23 vaccine dose should be obtained at least 8 weeks after the dose of PCV13 vaccine. An adult aged 19 years or older who has certain medical conditions and  previously received 1 or more doses of PPSV23 vaccine should receive 1 dose of PCV13. The PCV13 vaccine dose should be obtained 1 or more years after the last PPSV23 vaccine dose.  Pneumococcal polysaccharide (PPSV23) vaccine. When PCV13 is also indicated, PCV13 should be obtained first. All adults aged 65 years and older should be immunized. An adult younger than age 65 years who has certain medical conditions should be immunized. Any person who resides in a nursing home or long-term care facility should be immunized. An adult smoker should be immunized. People with an immunocompromised condition and certain other conditions should receive both PCV13 and PPSV23 vaccines. People with human immunodeficiency virus (HIV) infection should be immunized as soon as possible after diagnosis. Immunization during chemotherapy or radiation therapy should be avoided. Routine use of PPSV23 vaccine is not recommended for American Indians, [place] Natives, or people younger than 65 years unless there are medical conditions that require PPSV23 vaccine. When indicated, people who have unknown immunization and have no record of immunization should receive PPSV23 vaccine. One-time revaccination 5 years after the first dose of PPSV23 is recommended for people aged 19-64 years who have chronic kidney failure, nephrotic syndrome, asplenia, or immunocompromised conditions. People who received 1-2 doses of PPSV23 before age 65 years should receive another dose of PPSV23 vaccine at age 65 years or later if at least 5 years have passed since the previous dose. Doses of PPSV23 are not needed for people immunized with PPSV23 at or after age 65 years.  Meningococcal vaccine. Adults with asplenia or persistent complement component deficiencies should receive 2 doses of quadrivalent meningococcal conjugate (MenACWY-D) vaccine. The doses should be obtained at least 2 months apart. Microbiologists working with certain meningococcal bacteria,  military recruits, people at risk during an outbreak, and people who travel to or live in countries with a high rate of meningitis should be immunized. A first-year college student up through age 21 years who is living in a residence hall should receive a dose if he did not receive a dose on or after his 16th birthday. Adults who have certain high-risk conditions should receive one or more doses of vaccine.  Hepatitis A vaccine. Adults who wish to be protected from this disease, have certain high-risk conditions, work with hepatitis A-infected animals, work in hepatitis A research labs, or   travel to or work in countries with a high rate of hepatitis A should be immunized. Adults who were previously unvaccinated and who anticipate close contact with an international adoptee during the first 60 days after arrival in the [place] from a country with a high rate of hepatitis A should be immunized.  Hepatitis B vaccine. Adults should be immunized if they wish to be protected from this disease, have certain high-risk conditions, may be exposed to blood or other infectious body fluids, are household contacts or sex partners of hepatitis B positive people, are clients or workers in certain care facilities, or travel to or work in countries with a high rate of hepatitis B.  Haemophilus influenzae type b (Hib) vaccine. A previously unvaccinated person with asplenia or sickle cell disease or having a scheduled splenectomy should receive 1 dose of Hib vaccine. Regardless of previous immunization, a recipient of a hematopoietic stem cell transplant should receive a 3-dose series 6-12 months after his successful transplant. Hib vaccine is not recommended for adults with HIV infection. Preventive Service / Frequency Ages 52 to 17  Blood pressure check.** / Every 1 to 2 years.  Lipid and cholesterol check.** / Every 5 years beginning at age 69.  Hepatitis C blood test.** / For any individual with known risks for  hepatitis C.  Skin self-exam. / Monthly.  Influenza vaccine. / Every year.  Tetanus, diphtheria, and acellular pertussis (Tdap, Td) vaccine.** / Consult your health care provider. 1 dose of Td every 10 years.  Varicella vaccine.** / Consult your health care provider.  HPV vaccine. / 3 doses over 6 months, if 72 or younger.  Measles, mumps, rubella (MMR) vaccine.** / You need at least 1 dose of MMR if you were born in 1957 or later. You may also need a second dose.  Pneumococcal 13-valent conjugate (PCV13) vaccine.** / Consult your health care provider.  Pneumococcal polysaccharide (PPSV23) vaccine.** / 1 to 2 doses if you smoke cigarettes or if you have certain conditions.  Meningococcal vaccine.** / 1 dose if you are age 35 to 60 years and a Market researcher living in a residence hall, or have one of several medical conditions. You may also need additional booster doses.  Hepatitis A vaccine.** / Consult your health care provider.  Hepatitis B vaccine.** / Consult your health care provider.  Haemophilus influenzae type b (Hib) vaccine.** / Consult your health care provider. Ages 35 to 8  Blood pressure check.** / Every 1 to 2 years.  Lipid and cholesterol check.** / Every 5 years beginning at age 57.  Lung cancer screening. / Every year if you are aged 44-80 years and have a 30-pack-year history of smoking and currently smoke or have quit within the past 15 years. Yearly screening is stopped once you have quit smoking for at least 15 years or develop a health problem that would prevent you from having lung cancer treatment.  Fecal occult blood test (FOBT) of stool. / Every year beginning at age 55 and continuing until age 73. You may not have to do this test if you get a colonoscopy every 10 years.  Flexible sigmoidoscopy** or colonoscopy.** / Every 5 years for a flexible sigmoidoscopy or every 10 years for a colonoscopy beginning at age 28 and continuing until age  1.  Hepatitis C blood test.** / For all people born from [birth year] through 1965 and any individual with known risks for hepatitis C.  Skin self-exam. / Monthly.  Influenza vaccine. / Every  year.  Tetanus, diphtheria, and acellular pertussis (Tdap/Td) vaccine.** / Consult your health care provider. 1 dose of Td every 10 years.  Varicella vaccine.** / Consult your health care provider.  Zoster vaccine.** / 1 dose for adults aged 53 years or older.  Measles, mumps, rubella (MMR) vaccine.** / You need at least 1 dose of MMR if you were born in 1957 or later. You may also need a second dose.  Pneumococcal 13-valent conjugate (PCV13) vaccine.** / Consult your health care provider.  Pneumococcal polysaccharide (PPSV23) vaccine.** / 1 to 2 doses if you smoke cigarettes or if you have certain conditions.  Meningococcal vaccine.** / Consult your health care provider.  Hepatitis A vaccine.** / Consult your health care provider.  Hepatitis B vaccine.** / Consult your health care provider.  Haemophilus influenzae type b (Hib) vaccine.** / Consult your health care provider. Ages 77 and over  Blood pressure check.** / Every 1 to 2 years.  Lipid and cholesterol check.**/ Every 5 years beginning at age 85.  Lung cancer screening. / Every year if you are aged 55-80 years and have a 30-pack-year history of smoking and currently smoke or have quit within the past 15 years. Yearly screening is stopped once you have quit smoking for at least 15 years or develop a health problem that would prevent you from having lung cancer treatment.  Fecal occult blood test (FOBT) of stool. / Every year beginning at age 33 and continuing until age 11. You may not have to do this test if you get a colonoscopy every 10 years.  Flexible sigmoidoscopy** or colonoscopy.** / Every 5 years for a flexible sigmoidoscopy or every 10 years for a colonoscopy beginning at age 28 and continuing until age 73.  Hepatitis C blood  test.** / For all people born from [birth year] through 1965 and any individual with known risks for hepatitis C.  Abdominal aortic aneurysm (AAA) screening.** / A one-time screening for ages 50 to 27 years who are current or former smokers.  Skin self-exam. / Monthly.  Influenza vaccine. / Every year.  Tetanus, diphtheria, and acellular pertussis (Tdap/Td) vaccine.** / 1 dose of Td every 10 years.  Varicella vaccine.** / Consult your health care provider.  Zoster vaccine.** / 1 dose for adults aged 34 years or older.  Pneumococcal 13-valent conjugate (PCV13) vaccine.** / Consult your health care provider.  Pneumococcal polysaccharide (PPSV23) vaccine.** / 1 dose for all adults aged 63 years and older.  Meningococcal vaccine.** / Consult your health care provider.  Hepatitis A vaccine.** / Consult your health care provider.  Hepatitis B vaccine.** / Consult your health care provider.  Haemophilus influenzae type b (Hib) vaccine.** / Consult your health care provider. **Family history and personal history of risk and conditions may change your health care provider's recommendations. Document Released: 01/22/2002 Document Revised: 12/01/2013 Document Reviewed: 04/23/2011 [facility] Patient Information 2015 [place], [place]. This information is not intended to replace advice given to you by your health care provider. Make sure you discuss any questions you have with your health care provider.

## 2015-08-19 NOTE — Assessment & Plan Note (Signed)
Well controlled, no changes to meds. Encouraged heart healthy diet such as the DASH diet and exercise as tolerated.  °

## 2015-08-19 NOTE — Assessment & Plan Note (Signed)
Encouraged complete cessation. Discussed need to quit as relates to risk of numerous cancers, cardiac and pulmonary disease as well as neurologic complications. Counseled for greater than 3 minutes 

## 2015-08-19 NOTE — Assessment & Plan Note (Signed)
Patient encouraged to maintain heart healthy diet, regular exercise, adequate sleep. Consider daily probiotics. Take medications as prescribed. Given and reviewed copy of ACP documents from [place] Secretary of State and encouraged to complete and return 

## 2015-08-19 NOTE — Progress Notes (Signed)
Pre visit review using our clinic review tool, if applicable. No additional management support is needed unless otherwise documented below in the visit note. 

## 2015-08-20 LAB — HEPATITIS C ANTIBODY: HCV AB: NEGATIVE

## 2015-08-20 LAB — HIV ANTIBODY (ROUTINE TESTING W REFLEX): HIV: NONREACTIVE

## 2015-08-22 ENCOUNTER — Telehealth: Payer: Self-pay | Admitting: Family Medicine

## 2015-08-22 NOTE — Telephone Encounter (Signed)
Pt returning your call, best # [phone].

## 2015-08-22 NOTE — Telephone Encounter (Signed)
Returned call and notified of lab results.

## 2015-09-04 NOTE — Assessment & Plan Note (Signed)
hgba1c acceptable, minimize simple carbs. Increase exercise as tolerated.  

## 2015-09-04 NOTE — Assessment & Plan Note (Signed)
Tolerating statin, encouraged heart healthy diet, avoid trans fats, minimize simple carbs and saturated fats. Increase exercise as tolerated 

## 2015-11-09 ENCOUNTER — Telehealth: Payer: Self-pay | Admitting: Family Medicine

## 2015-11-09 NOTE — Telephone Encounter (Signed)
Flu shot record.//AB/CMA

## 2015-11-09 NOTE — Telephone Encounter (Signed)
Called patient to schedule Flu Shot on 11/30. Patient declined because he had his Flu Shot done in October at [facility]

## 2015-11-15 ENCOUNTER — Telehealth: Payer: Self-pay

## 2015-11-15 NOTE — Telephone Encounter (Signed)
AWV lm

## 2016-02-20 ENCOUNTER — Ambulatory Visit (INDEPENDENT_AMBULATORY_CARE_PROVIDER_SITE_OTHER): Payer: Medicare Other | Admitting: Family Medicine

## 2016-02-20 ENCOUNTER — Encounter: Payer: Self-pay | Admitting: Family Medicine

## 2016-02-20 ENCOUNTER — Telehealth: Payer: Self-pay | Admitting: Family Medicine

## 2016-02-20 VITALS — BP 110/72 | HR 73 | Temp 97.8°F | Ht 69.0 in | Wt 182.0 lb

## 2016-02-20 DIAGNOSIS — E785 Hyperlipidemia, unspecified: Secondary | ICD-10-CM | POA: Diagnosis not present

## 2016-02-20 DIAGNOSIS — I10 Essential (primary) hypertension: Secondary | ICD-10-CM

## 2016-02-20 DIAGNOSIS — F17200 Nicotine dependence, unspecified, uncomplicated: Secondary | ICD-10-CM

## 2016-02-20 DIAGNOSIS — N528 Other male erectile dysfunction: Secondary | ICD-10-CM | POA: Diagnosis not present

## 2016-02-20 DIAGNOSIS — R739 Hyperglycemia, unspecified: Secondary | ICD-10-CM | POA: Diagnosis not present

## 2016-02-20 LAB — CBC
HCT: 48.4 % (ref 39.0–52.0)
Hemoglobin: 16.7 g/dL (ref 13.0–17.0)
MCHC: 34.5 g/dL (ref 30.0–36.0)
MCV: 98 fl (ref 78.0–100.0)
Platelets: 281 10*3/uL (ref 150.0–400.0)
RBC: 4.93 Mil/uL (ref 4.22–5.81)
RDW: 13.7 % (ref 11.5–15.5)
WBC: 8.3 10*3/uL (ref 4.0–10.5)

## 2016-02-20 LAB — TSH: TSH: 2.7 u[IU]/mL (ref 0.35–4.50)

## 2016-02-20 LAB — COMPREHENSIVE METABOLIC PANEL
ALK PHOS: 74 U/L (ref 39–117)
ALT: 18 U/L (ref 0–53)
AST: 17 U/L (ref 0–37)
Albumin: 4.5 g/dL (ref 3.5–5.2)
BILIRUBIN TOTAL: 0.5 mg/dL (ref 0.2–1.2)
BUN: 11 mg/dL (ref 6–23)
CO2: 29 meq/L (ref 19–32)
Calcium: 9.5 mg/dL (ref 8.4–10.5)
Chloride: 104 mEq/L (ref 96–112)
Creatinine, Ser: 0.92 mg/dL (ref 0.40–1.50)
GFR: 87.41 mL/min (ref >60.00)
GLUCOSE: 110 mg/dL — AB (ref 70–99)
Potassium: 4.1 mEq/L (ref 3.5–5.1)
SODIUM: 139 meq/L (ref 135–145)
TOTAL PROTEIN: 6.9 g/dL (ref 6.0–8.3)

## 2016-02-20 LAB — LIPID PANEL
CHOL/HDL RATIO: 4
Cholesterol: 192 mg/dL (ref 0–200)
HDL: 54.8 mg/dL (ref >39.00)
LDL Cholesterol: 107 mg/dL — ABNORMAL HIGH (ref 0–99)
NONHDL: 137.53
Triglycerides: 151 mg/dL — ABNORMAL HIGH (ref 0.0–149.0)
VLDL: 30.2 mg/dL (ref 0.0–40.0)

## 2016-02-20 LAB — HEMOGLOBIN A1C: HEMOGLOBIN A1C: 5.6 % (ref 4.6–6.5)

## 2016-02-20 MED ORDER — SILDENAFIL CITRATE 20 MG PO TABS: 20.0000 mg | ORAL_TABLET | Freq: Every day | ORAL | Status: DC | PRN

## 2016-02-20 MED ORDER — ATORVASTATIN CALCIUM 20 MG PO TABS: ORAL_TABLET | ORAL | Status: DC

## 2016-02-20 MED ORDER — LISINOPRIL 20 MG PO TABS: ORAL_TABLET | ORAL | Status: DC

## 2016-02-20 NOTE — Assessment & Plan Note (Signed)
Encouraged complete cessation. Discussed need to quit as relates to risk of numerous cancers, cardiac and pulmonary disease as well as neurologic complications. Counseled for greater than 3 minutes 

## 2016-02-20 NOTE — Progress Notes (Signed)
Pre visit review using our clinic review tool, if applicable. No additional management support is needed unless otherwise documented below in the visit note. 

## 2016-02-20 NOTE — Assessment & Plan Note (Signed)
minimize simple carbs. Increase exercise as tolerated.  

## 2016-02-20 NOTE — Assessment & Plan Note (Signed)
Tolerating statin, encouraged heart healthy diet, avoid trans fats, minimize simple carbs and saturated fats. Increase exercise as tolerated 

## 2016-02-20 NOTE — Progress Notes (Signed)
Subjective:    Patient ID: Taylor Newman, female    DOB: Jul 19, 1951, 65 y.o.   MRN: [id number]  Chief Complaint  Patient presents with  . Follow-up    HPI Patient is in today for follow up. Feels well today. No recent illness, no hospitalized. Continues to smoke a 1/2 ppd. Denies CP/palp/SOB/HA/congestion/fevers/GI or GU c/o. Taking meds as prescribed  Past Medical History  Diagnosis Date  . History of chicken pox   . Heart murmur   . Hypertension   . Hyperlipidemia   . History of colon polyps   . Chest pain 01/12/2013  . Grief reaction 07/15/2013  . Sun-damaged skin 01/13/2014  . Tobacco use disorder 01/13/2014  . Preventative health care 08/13/2014  . Laceration of hand 10/24/2014  . FHx: BRCA2 gene positive 02/20/2015    Sister with this and breast cancer.    Past Surgical History  Procedure Laterality Date  . Tonsillectomy  1954  . Colonoscopy      Family History  Problem Relation Age of Onset  . Colon cancer Father 56  . Hyperlipidemia Father   . Heart disease Father     stent placement  . Hypertension Father   . Breast cancer Sister     BRCA2 positive  . Hyperlipidemia Mother   . Diabetes Maternal Grandmother     father  . Prostate cancer Neg Hx   . Cancer Son 59  . Colon cancer Son     Joslyn Hy - not biologically related  . Breast cancer Paternal Aunt     2 paternal aunts with breast cancer postmenopausal  . Breast cancer Cousin     3 pat female first cousins with breast cancer  . Breast cancer Other     niece with breast cancer    Social History   Social History  . Marital Status: Married    Spouse Name: N/A  . Number of Children: N/A  . Years of Education: N/A   Occupational History  . Not on file.   Social History Main Topics  . Smoking status: Current Every Day Smoker -- 1.00 packs/day for 45 years    Types: Cigarettes  . Smokeless tobacco: Never Used     Comment: 1 ppd  . Alcohol Use: 4.8 oz/week    8 Cans of beer per week  . Drug Use:  No  . Sexual Activity: Not on file   Other Topics Concern  . Not on file   Social History Narrative    Outpatient Prescriptions Prior to Visit  Medication Sig Dispense Refill  . aspirin 81 MG chewable tablet Chew 81 mg by mouth daily.    Marland Kitchen KRILL OIL PO Take 1 capsule by mouth daily.    Marland Kitchen lactobacillus acidophilus (BACID) TABS tablet Take 1 tablet by mouth daily.    . Multiple Vitamin (MULTIVITAMIN) tablet Take 1 tablet by mouth daily.    . psyllium (REGULOID) 0.52 G capsule Take 0.52 g by mouth daily.    Marland Kitchen atorvastatin (LIPITOR) 20 MG tablet TAKE 1 TABLET (20 MG) BY MOUTH DAILY. 90 tablet 1  . lisinopril (PRINIVIL,ZESTRIL) 20 MG tablet TAKE 1 TABLET (20 MG) BY MOUTH DAILY. 90 tablet 1  . sildenafil (REVATIO) 20 MG tablet Take 1 tablet (20 mg total) by mouth daily as needed. 35 tablet 1   No facility-administered medications prior to visit.    No Known Allergies  Review of Systems  Constitutional: Negative for fever and malaise/fatigue.  HENT: Negative for congestion.  Eyes: Negative for blurred vision.  Respiratory: Negative for shortness of breath.   Cardiovascular: Negative for chest pain, palpitations and leg swelling.  Gastrointestinal: Negative for nausea, abdominal pain and blood in stool.  Genitourinary: Negative for dysuria and frequency.  Musculoskeletal: Negative for falls.  Skin: Negative for rash.  Neurological: Negative for dizziness, loss of consciousness and headaches.  Endo/Heme/Allergies: Negative for environmental allergies.  Psychiatric/Behavioral: Negative for depression. The patient is not nervous/anxious.        Objective:    Physical Exam  Constitutional: He is oriented to person, place, and time. He appears well-developed and well-nourished. No distress.  HENT:  Head: Normocephalic and atraumatic.  Nose: Nose normal.  Eyes: Right eye exhibits no discharge. Left eye exhibits no discharge.  Neck: Normal range of motion. Neck supple.    Cardiovascular: Normal rate and regular rhythm.   No murmur heard. Pulmonary/Chest: Effort normal and breath sounds normal.  Abdominal: Soft. Bowel sounds are normal. There is no tenderness.  Musculoskeletal: He exhibits no edema.  Neurological: He is alert and oriented to person, place, and time.  Skin: Skin is warm and dry.  Psychiatric: He has a normal mood and affect.  Nursing note and vitals reviewed.   BP 110/72 mmHg  Pulse 73  Temp(Src) 97.8 F (36.6 C) (Oral)  Ht '5\' 9"'$  (1.753 m)  Wt 182 lb (82.555 kg)  BMI 26.86 kg/m2  SpO2 99% Wt Readings from Last 3 Encounters:  02/20/16 182 lb (82.555 kg)  08/19/15 179 lb 6 oz (81.364 kg)  02/11/15 172 lb 6 oz (78.189 kg)     Lab Results  Component Value Date   WBC 8.2 08/19/2015   HGB 16.2 08/19/2015   HCT 47.1 08/19/2015   PLT 275.0 08/19/2015   GLUCOSE 99 08/19/2015   CHOL 208* 08/19/2015   TRIG 120.0 08/19/2015   HDL 57.10 08/19/2015   LDLCALC 127* 08/19/2015   ALT 15 08/19/2015   AST 15 08/19/2015   NA 138 08/19/2015   K 4.6 08/19/2015   CL 101 08/19/2015   CREATININE 0.93 08/19/2015   BUN 16 08/19/2015   CO2 30 08/19/2015   TSH 2.55 08/19/2015   PSA 0.26 08/19/2015   HGBA1C 5.4 08/19/2015   MICROALBUR <0.7 08/19/2015    Lab Results  Component Value Date   TSH 2.55 08/19/2015   Lab Results  Component Value Date   WBC 8.2 08/19/2015   HGB 16.2 08/19/2015   HCT 47.1 08/19/2015   MCV 99.0 08/19/2015   PLT 275.0 08/19/2015   Lab Results  Component Value Date   NA 138 08/19/2015   K 4.6 08/19/2015   CO2 30 08/19/2015   GLUCOSE 99 08/19/2015   BUN 16 08/19/2015   CREATININE 0.93 08/19/2015   BILITOT 0.6 08/19/2015   ALKPHOS 83 08/19/2015   AST 15 08/19/2015   ALT 15 08/19/2015   PROT 6.8 08/19/2015   ALBUMIN 4.5 08/19/2015   CALCIUM 9.8 08/19/2015   GFR 86.46 08/19/2015   Lab Results  Component Value Date   CHOL 208* 08/19/2015   Lab Results  Component Value Date   HDL 57.10 08/19/2015    Lab Results  Component Value Date   LDLCALC 127* 08/19/2015   Lab Results  Component Value Date   TRIG 120.0 08/19/2015   Lab Results  Component Value Date   CHOLHDL 4 08/19/2015   Lab Results  Component Value Date   HGBA1C 5.4 08/19/2015       Assessment & Plan:  Problem List Items Addressed This Visit    HTN (hypertension)    Well controlled, no changes to meds. Encouraged heart healthy diet such as the DASH diet and exercise as tolerated.       Relevant Medications   lisinopril (PRINIVIL,ZESTRIL) 20 MG tablet   atorvastatin (LIPITOR) 20 MG tablet   sildenafil (REVATIO) 20 MG tablet   Other Relevant Orders   CBC   TSH   Comprehensive metabolic panel   Lipid panel   Hemoglobin A1c   Hyperglycemia     minimize simple carbs. Increase exercise as tolerated.       Relevant Orders   CBC   TSH   Comprehensive metabolic panel   Lipid panel   Hemoglobin A1c   Hyperlipidemia    Tolerating statin, encouraged heart healthy diet, avoid trans fats, minimize simple carbs and saturated fats. Increase exercise as tolerated      Relevant Medications   lisinopril (PRINIVIL,ZESTRIL) 20 MG tablet   atorvastatin (LIPITOR) 20 MG tablet   sildenafil (REVATIO) 20 MG tablet   Other Relevant Orders   CBC   TSH   Comprehensive metabolic panel   Lipid panel   Hemoglobin A1c   Tobacco use disorder    Encouraged complete cessation. Discussed need to quit as relates to risk of numerous cancers, cardiac and pulmonary disease as well as neurologic complications. 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 Seidenberg  maintain his aspirin, KRILL OIL PO, lactobacillus acidophilus, multivitamin, psyllium, lisinopril, atorvastatin, and sildenafil.  Meds ordered this encounter  Medications  . lisinopril (PRINIVIL,ZESTRIL) 20 MG tablet    Sig: TAKE 1 TABLET (20 MG) BY MOUTH DAILY.    Dispense:  90 tablet    Refill:  1  . atorvastatin (LIPITOR) 20 MG tablet    Sig: TAKE 1 TABLET (20 MG) BY MOUTH DAILY.    Dispense:  90 tablet    Refill:  1    D/C PREVIOUS SCRIPTS FOR THIS MEDICATION  . sildenafil (REVATIO) 20 MG tablet    Sig: Take 1 tablet (20 mg total) by mouth daily as needed.    Dispense:  35 tablet    Refill:  1     Penni Homans, MD

## 2016-02-20 NOTE — Assessment & Plan Note (Signed)
Well controlled, no changes to meds. Encouraged heart healthy diet such as the DASH diet and exercise as tolerated.  °

## 2016-02-20 NOTE — Patient Instructions (Signed)

## 2016-02-20 NOTE — Telephone Encounter (Signed)
Pt returned your call for lab results.  

## 2016-03-19 ENCOUNTER — Telehealth: Payer: Self-pay | Admitting: *Deleted

## 2016-03-19 NOTE — Telephone Encounter (Signed)
PA initiated through [email or web address]. Awaiting determination. JG//CMA 

## 2016-05-15 NOTE — Telephone Encounter (Signed)
Caller name: Lucius Conn at [facility] Can be reached: [phone] x [phone]   Reason for call: Please call with clinical info on appeal for sildenifil

## 2016-05-16 MED ORDER — SILVER SULFADIAZINE 1 % EX CREA: 1.0000 "application " | TOPICAL_CREAM | Freq: Every day | CUTANEOUS | Status: DC

## 2016-05-16 NOTE — Addendum Note (Signed)
Addended by: Bunnie Domino on: 05/16/2016 02:42 PM   Modules accepted: Orders, Medications

## 2016-05-17 NOTE — Telephone Encounter (Signed)
[employer] wanted form filled out that was faxed and stuck in folder for Dr. Charlett Blake on 05/14/16. Deadline was 05/16/16 by EOB. Informed [employer] that I was out of the office and form wasn't found by me until today. Form filled out and faxed to [phone]. JG//CMA

## 2016-05-21 NOTE — Telephone Encounter (Signed)
PA and appeal both denied by [facility]. Pt will have to pay out of pocket for medication. They will not cover any medication for ED. JG//CMA

## 2016-05-21 NOTE — Telephone Encounter (Signed)
Please let patient know insurance has declined to pay for Sildenafil despite PA and appeal.

## 2016-05-24 ENCOUNTER — Encounter: Payer: Self-pay | Admitting: Family Medicine

## 2016-05-24 NOTE — Telephone Encounter (Signed)
Message sent to pt.

## 2016-05-24 NOTE — Telephone Encounter (Signed)
Please advise in Dr Frederik Pear absence?

## 2016-05-24 NOTE — Telephone Encounter (Signed)
wellbutrin can make anxiety worse--- If he discussed this with Dr Randel Pigg we can forward this to her.   Happy to see him tomorrow to discuss it if he would like or Dr Charlett Blake will be back Monday

## 2016-05-25 ENCOUNTER — Other Ambulatory Visit: Payer: Self-pay | Admitting: Family Medicine

## 2016-05-25 DIAGNOSIS — F329 Major depressive disorder, single episode, unspecified: Secondary | ICD-10-CM

## 2016-05-25 DIAGNOSIS — F32A Depression, unspecified: Secondary | ICD-10-CM

## 2016-05-25 DIAGNOSIS — F17200 Nicotine dependence, unspecified, uncomplicated: Secondary | ICD-10-CM

## 2016-05-25 MED ORDER — BUPROPION HCL ER (XL) 300 MG PO TB24: 300.0000 mg | ORAL_TABLET | Freq: Every day | ORAL | Status: DC

## 2016-05-25 MED ORDER — BUPROPION HCL ER (XL) 150 MG PO TB24: 150.0000 mg | ORAL_TABLET | Freq: Every day | ORAL | Status: DC

## 2016-08-15 ENCOUNTER — Encounter: Payer: Self-pay | Admitting: Endocrinology

## 2016-08-15 DIAGNOSIS — E291 Testicular hypofunction: Secondary | ICD-10-CM | POA: Diagnosis not present

## 2016-08-21 DIAGNOSIS — H524 Presbyopia: Secondary | ICD-10-CM | POA: Diagnosis not present

## 2016-08-21 DIAGNOSIS — H52222 Regular astigmatism, left eye: Secondary | ICD-10-CM | POA: Diagnosis not present

## 2016-08-21 DIAGNOSIS — H5203 Hypermetropia, bilateral: Secondary | ICD-10-CM | POA: Diagnosis not present

## 2016-08-21 DIAGNOSIS — H2513 Age-related nuclear cataract, bilateral: Secondary | ICD-10-CM | POA: Diagnosis not present

## 2016-09-11 ENCOUNTER — Encounter: Payer: Self-pay | Admitting: Family Medicine

## 2016-09-20 ENCOUNTER — Other Ambulatory Visit: Payer: Self-pay | Admitting: Family Medicine

## 2016-09-20 DIAGNOSIS — E785 Hyperlipidemia, unspecified: Secondary | ICD-10-CM

## 2016-09-20 DIAGNOSIS — I10 Essential (primary) hypertension: Secondary | ICD-10-CM

## 2016-10-24 DIAGNOSIS — E291 Testicular hypofunction: Secondary | ICD-10-CM | POA: Diagnosis not present

## 2016-10-24 DIAGNOSIS — K719 Toxic liver disease, unspecified: Secondary | ICD-10-CM | POA: Diagnosis not present

## 2016-10-30 ENCOUNTER — Ambulatory Visit (INDEPENDENT_AMBULATORY_CARE_PROVIDER_SITE_OTHER): Payer: Medicare Other | Admitting: Family Medicine

## 2016-10-30 ENCOUNTER — Other Ambulatory Visit: Payer: Self-pay | Admitting: Family Medicine

## 2016-10-30 ENCOUNTER — Encounter: Payer: Self-pay | Admitting: Family Medicine

## 2016-10-30 DIAGNOSIS — E782 Mixed hyperlipidemia: Secondary | ICD-10-CM

## 2016-10-30 DIAGNOSIS — Z0000 Encounter for general adult medical examination without abnormal findings: Secondary | ICD-10-CM | POA: Diagnosis not present

## 2016-10-30 DIAGNOSIS — F17200 Nicotine dependence, unspecified, uncomplicated: Secondary | ICD-10-CM

## 2016-10-30 DIAGNOSIS — R350 Frequency of micturition: Secondary | ICD-10-CM | POA: Diagnosis not present

## 2016-10-30 DIAGNOSIS — Z23 Encounter for immunization: Secondary | ICD-10-CM | POA: Diagnosis not present

## 2016-10-30 DIAGNOSIS — F4320 Adjustment disorder, unspecified: Secondary | ICD-10-CM

## 2016-10-30 DIAGNOSIS — I10 Essential (primary) hypertension: Secondary | ICD-10-CM

## 2016-10-30 DIAGNOSIS — R739 Hyperglycemia, unspecified: Secondary | ICD-10-CM

## 2016-10-30 DIAGNOSIS — Z789 Other specified health status: Secondary | ICD-10-CM

## 2016-10-30 DIAGNOSIS — IMO0001 Reserved for inherently not codable concepts without codable children: Secondary | ICD-10-CM

## 2016-10-30 DIAGNOSIS — F640 Transsexualism: Secondary | ICD-10-CM

## 2016-10-30 DIAGNOSIS — E875 Hyperkalemia: Secondary | ICD-10-CM

## 2016-10-30 DIAGNOSIS — F4321 Adjustment disorder with depressed mood: Secondary | ICD-10-CM

## 2016-10-30 HISTORY — DX: Other specified health status: Z78.9

## 2016-10-30 LAB — URINALYSIS, ROUTINE W REFLEX MICROSCOPIC
BILIRUBIN URINE: NEGATIVE
KETONES UR: NEGATIVE
NITRITE: NEGATIVE
Specific Gravity, Urine: <=1.005 — AB (ref 1.000–1.030)
TOTAL PROTEIN, URINE-UPE24: NEGATIVE
URINE GLUCOSE: NEGATIVE
UROBILINOGEN UA: 0.2 (ref 0.0–1.0)
pH: 5.5 (ref 5.0–8.0)

## 2016-10-30 LAB — LIPID PANEL
CHOL/HDL RATIO: 3
Cholesterol: 200 mg/dL (ref 0–200)
HDL: 68.1 mg/dL (ref >39.00)
LDL Cholesterol: 99 mg/dL (ref 0–99)
NONHDL: 132.23
TRIGLYCERIDES: 164 mg/dL — AB (ref 0.0–149.0)
VLDL: 32.8 mg/dL (ref 0.0–40.0)

## 2016-10-30 LAB — COMPREHENSIVE METABOLIC PANEL
ALT: 17 U/L (ref 0–53)
AST: 16 U/L (ref 0–37)
Albumin: 4.4 g/dL (ref 3.5–5.2)
Alkaline Phosphatase: 63 U/L (ref 39–117)
BILIRUBIN TOTAL: 0.7 mg/dL (ref 0.2–1.2)
BUN: 20 mg/dL (ref 6–23)
CALCIUM: 10.2 mg/dL (ref 8.4–10.5)
CO2: 27 meq/L (ref 19–32)
CREATININE: 1.27 mg/dL (ref 0.40–1.50)
Chloride: 95 mEq/L — ABNORMAL LOW (ref 96–112)
GFR: 60.13 mL/min (ref >60.00)
GLUCOSE: 95 mg/dL (ref 70–99)
Potassium: 5.5 mEq/L — ABNORMAL HIGH (ref 3.5–5.1)
Sodium: 130 mEq/L — ABNORMAL LOW (ref 135–145)
Total Protein: 6.7 g/dL (ref 6.0–8.3)

## 2016-10-30 LAB — CBC
HCT: 40.7 % (ref 39.0–52.0)
Hemoglobin: 13.7 g/dL (ref 13.0–17.0)
MCHC: 33.8 g/dL (ref 30.0–36.0)
MCV: 100.4 fl — ABNORMAL HIGH (ref 78.0–100.0)
PLATELETS: 380 10*3/uL (ref 150.0–400.0)
RBC: 4.05 Mil/uL — ABNORMAL LOW (ref 4.22–5.81)
RDW: 12.9 % (ref 11.5–15.5)
WBC: 9.9 10*3/uL (ref 4.0–10.5)

## 2016-10-30 LAB — HEMOGLOBIN A1C: HEMOGLOBIN A1C: 5.6 % (ref 4.6–6.5)

## 2016-10-30 LAB — TSH: TSH: 3.54 u[IU]/mL (ref 0.35–4.50)

## 2016-10-30 LAB — PSA: PSA: 0.39 ng/mL (ref 0.10–4.00)

## 2016-10-30 MED ORDER — BUPROPION HCL ER (XL) 150 MG PO TB24: 150.0000 mg | ORAL_TABLET | Freq: Every day | ORAL | 0 refills | Status: DC

## 2016-10-30 MED ORDER — AMLODIPINE BESYLATE 5 MG PO TABS: 5.0000 mg | ORAL_TABLET | Freq: Every day | ORAL | 3 refills | Status: DC

## 2016-10-30 NOTE — Assessment & Plan Note (Signed)
Sees endocrinology Dr Linward Natal at [facility] in [place]. Will request lab results from office

## 2016-10-30 NOTE — Assessment & Plan Note (Signed)
Encouraged heart healthy diet, increase exercise, avoid trans fats, consider a krill oil cap daily 

## 2016-10-30 NOTE — Progress Notes (Signed)
Patient ID: Taylor Newman, female   DOB: [birth year], 65 y.o.   MRN: [id number]   Subjective:    Patient ID: Taylor Newman, female    DOB: 08-07-1951, 65 y.o.   MRN: [id number]  Chief Complaint  Patient presents with  . Annual Exam    HPI Patient is in today for annual exam pt c/o of shortness of breath pt sttes that she notices when she drink more water it helps with the sob. Pt also c/o of urinary frequency with no discomfort or pain. No hesitancy. Has just begun the process of transitioning from female to female at work and it is going well. Is seeing Dr Rosario Jacks in [place] to help witht he endocrine aspect of the transition. Is tolerating Spironolactone. Denies CP/palp/HA/congestion/fevers/GI or GU c/o. Taking meds as prescribed  Past Medical History:  Diagnosis Date  . Chest pain 01/12/2013  . FHx: BRCA2 gene positive 02/20/2015   Sister with this and breast cancer.  . Grief reaction 07/15/2013  . Heart murmur   . History of chicken pox   . History of colon polyps   . Hyperlipidemia   . Hypertension   . Laceration of hand 10/24/2014  . Female-to-female transgender person 10/30/2016  . Female-to-female transgender person 10/30/2016   Sees endocrinology Dr Rosario Jacks at [facility] in [place]  . Preventative health care 08/13/2014  . Sun-damaged skin 01/13/2014  . Tobacco use disorder 01/13/2014    Past Surgical History:  Procedure Laterality Date  . COLONOSCOPY    . TONSILLECTOMY  1954    Family History  Problem Relation Age of Onset  . Colon cancer Father 66  . Hyperlipidemia Father   . Heart disease Father     stent placement  . Hypertension Father   . Cancer Father 75    colon with liver mets  . Diabetes Father   . Breast cancer Sister     BRCA2 positive  . Cancer Sister     twice, breast cancer, BRCA 2 gene mutation  . Hyperlipidemia Mother   . Alzheimer's disease Mother   . Hypertension Mother   . Diabetes Maternal Grandmother    father  . Prostate cancer Neg Hx   . Cancer Son 108  . Colon cancer Son     Doreene Adas - not biologically related  . Breast cancer Paternal Aunt     2 paternal aunts with breast cancer postmenopausal  . Breast cancer Cousin     3 pat female first cousins with breast cancer  . Breast cancer Other     niece with breast cancer  . Mental illness Son     bipolar  . Allergies Sister     Social History   Social History  . Marital status: Married    Spouse name: N/A  . Number of children: N/A  . Years of education: N/A   Occupational History  . Not on file.   Social History Main Topics  . Smoking status: Current Every Day Smoker    Packs/day: 1.00    Years: 45.00    Types: Cigarettes  . Smokeless tobacco: Never Used     Comment: 1/2  ppd  . Alcohol use 4.8 oz/week    8 Cans of beer per week  . Drug use: No  . Sexual activity: Not on file   Other Topics Concern  . Not on file   Social History Narrative  . No narrative on file    Outpatient  Medications Prior to Visit  Medication Sig Dispense Refill  . aspirin 81 MG chewable tablet Chew 81 mg by mouth daily.    Marland Kitchen atorvastatin (LIPITOR) 20 MG tablet TAKE 1 TABLET (20 MG) BY MOUTH DAILY. 90 tablet 1  . KRILL OIL PO Take 1 capsule by mouth daily.    Marland Kitchen lactobacillus acidophilus (BACID) TABS tablet Take 1 tablet by mouth daily.    Marland Kitchen lisinopril (PRINIVIL,ZESTRIL) 20 MG tablet TAKE 1 TABLET (20 MG) BY MOUTH DAILY. 90 tablet 1  . Multiple Vitamin (MULTIVITAMIN) tablet Take 1 tablet by mouth daily.    . psyllium (REGULOID) 0.52 G capsule Take 0.52 g by mouth daily.    Marland Kitchen buPROPion (WELLBUTRIN XL) 150 MG 24 hr tablet Take 1 tablet (150 mg total) by mouth daily. 60 tablet 0  . buPROPion (WELLBUTRIN XL) 300 MG 24 hr tablet Take 1 tablet (300 mg total) by mouth daily. 30 tablet 5  . sildenafil (REVATIO) 20 MG tablet Take 1 tablet (20 mg total) by mouth daily as needed. (Patient not taking: Reported on 10/30/2016) 35 tablet 1   No  facility-administered medications prior to visit.     No Known Allergies  Review of Systems  Constitutional: Negative for fever.  Eyes: Negative for blurred vision.  Respiratory: Positive for shortness of breath. Negative for cough.   Cardiovascular: Negative for chest pain and palpitations.  Gastrointestinal: Negative for vomiting.  Genitourinary: Positive for frequency.  Musculoskeletal: Negative for back pain.  Skin: Negative for rash.  Neurological: Negative for loss of consciousness and headaches.       Objective:    Physical Exam  Constitutional: He is oriented to person, place, and time. He appears well-developed and well-nourished. No distress.  HENT:  Head: Normocephalic and atraumatic.  Eyes: Conjunctivae are normal.  Neck: Normal range of motion. No thyromegaly present.  Cardiovascular: Normal rate and regular rhythm.   Pulmonary/Chest: Effort normal and breath sounds normal. He has no wheezes.  Abdominal: Soft. Bowel sounds are normal. There is no tenderness.  Musculoskeletal: Normal range of motion. He exhibits no edema or deformity.  Neurological: He is alert and oriented to person, place, and time.  Skin: Skin is warm and dry. He is not diaphoretic.  Psychiatric: He has a normal mood and affect.    BP 112/78 (BP Location: Left Arm, Patient Position: Sitting, Cuff Size: Normal)   Pulse 73   Temp 98.1 F (36.7 C) (Oral)   Ht 5' 8.5" (1.74 m)   Wt 181 lb 6.4 oz (82.3 kg)   SpO2 99%   BMI 27.18 kg/m  Wt Readings from Last 3 Encounters:  10/30/16 181 lb 6.4 oz (82.3 kg)  02/20/16 182 lb (82.6 kg)  08/19/15 179 lb 6 oz (81.4 kg)     Lab Results  Component Value Date   WBC 9.9 10/30/2016   HGB 13.7 10/30/2016   HCT 40.7 10/30/2016   PLT 380.0 10/30/2016   GLUCOSE 104 (H) 11/06/2016   CHOL 200 10/30/2016   TRIG 164.0 (H) 10/30/2016   HDL 68.10 10/30/2016   LDLCALC 99 10/30/2016   ALT 18 11/06/2016   AST 16 11/06/2016   NA 136 11/06/2016   K 5.4  (H) 11/06/2016   CL 101 11/06/2016   CREATININE 1.30 11/06/2016   BUN 25 (H) 11/06/2016   CO2 29 11/06/2016   TSH 3.54 10/30/2016   PSA 0.39 10/30/2016   HGBA1C 5.6 10/30/2016   MICROALBUR <0.7 08/19/2015    Lab Results  Component Value Date   TSH 3.54 10/30/2016   Lab Results  Component Value Date   WBC 9.9 10/30/2016   HGB 13.7 10/30/2016   HCT 40.7 10/30/2016   MCV 100.4 (H) 10/30/2016   PLT 380.0 10/30/2016   Lab Results  Component Value Date   NA 136 11/06/2016   K 5.4 (H) 11/06/2016   CO2 29 11/06/2016   GLUCOSE 104 (H) 11/06/2016   BUN 25 (H) 11/06/2016   CREATININE 1.30 11/06/2016   BILITOT 0.4 11/06/2016   ALKPHOS 61 11/06/2016   AST 16 11/06/2016   ALT 18 11/06/2016   PROT 6.9 11/06/2016   ALBUMIN 4.3 11/06/2016   CALCIUM 10.1 11/06/2016   GFR 58.53 (L) 11/06/2016   Lab Results  Component Value Date   CHOL 200 10/30/2016   Lab Results  Component Value Date   HDL 68.10 10/30/2016   Lab Results  Component Value Date   LDLCALC 99 10/30/2016   Lab Results  Component Value Date   TRIG 164.0 (H) 10/30/2016   Lab Results  Component Value Date   CHOLHDL 3 10/30/2016   Lab Results  Component Value Date   HGBA1C 5.6 10/30/2016       Assessment & Plan:   Problem List Items Addressed This Visit    Hyperlipidemia    Encouraged heart healthy diet, increase exercise, avoid trans fats, consider a krill oil cap daily      Relevant Medications   spironolactone (ALDACTONE) 100 MG tablet   amLODipine (NORVASC) 5 MG tablet   Other Relevant Orders   Lipid panel (Completed)   HTN (hypertension)    Well controlled, no changes to meds. Encouraged heart healthy diet such as the DASH diet and exercise as tolerated. Notes some hoarseness and would like to try coming off of Lisinopril will switch to Amlodipine 5 mg daily      Relevant Medications   spironolactone (ALDACTONE) 100 MG tablet   amLODipine (NORVASC) 5 MG tablet   Other Relevant Orders    CBC (Completed)   Comprehensive metabolic panel (Completed)   TSH (Completed)   Hyperglycemia    .hgba1c acceptable, minimize simple carbs. Increase exercise as tolerated.       Relevant Orders   Hemoglobin A1c (Completed)   Grief reaction    Feels well will titrate off the Wellbutrin drop to 150 mg daily x 1 month and then stop as tolerated      Tobacco use disorder    Continues to abstain      Preventative health care    Patient encouraged to maintain heart healthy diet, regular exercise, adequate sleep. Consider daily probiotics. Take medications as prescribed. Check labs today. Given and reviewed copy of ACP documents from [employer] and encouraged to complete and return      Relevant Orders   Hemoglobin A1c (Completed)   CBC (Completed)   Comprehensive metabolic panel (Completed)   Lipid panel (Completed)   TSH (Completed)   Urinalysis   Urine culture (Completed)   PSA (Completed)   Increased urinary frequency    Likely related to spironolactone but will check UA and culture with sensitivities      Relevant Orders   Urinalysis   Urine culture (Completed)   PSA (Completed)   Female-to-female transgender person    Sees endocrinology Dr Linward Natal at [facility] in [place]. Will request lab results from office       Other Visit Diagnoses    Smoking  Relevant Medications   buPROPion (WELLBUTRIN XL) 150 MG 24 hr tablet      I have discontinued Mr. Marsan sildenafil. I am also having him start on amLODipine. Additionally, I am having him maintain his aspirin, KRILL OIL PO, lactobacillus acidophilus, multivitamin, psyllium, lisinopril, atorvastatin, spironolactone, estradiol, and buPROPion.  Meds ordered this encounter  Medications  . spironolactone (ALDACTONE) 100 MG tablet    Sig: Take 1 tablet by mouth 2 (two) times daily.  Marland Kitchen estradiol (ESTRACE) 2 MG tablet    Sig: Take 1 tablet by mouth daily.  Marland Kitchen amLODipine (NORVASC)  5 MG tablet    Sig: Take 1 tablet (5 mg total) by mouth daily.    Dispense:  90 tablet    Refill:  3  . buPROPion (WELLBUTRIN XL) 150 MG 24 hr tablet    Sig: Take 1 tablet (150 mg total) by mouth daily.    Dispense:  60 tablet    Refill:  0     Penni Homans, MD

## 2016-10-30 NOTE — Assessment & Plan Note (Addendum)
Patient encouraged to maintain heart healthy diet, regular exercise, adequate sleep. Consider daily probiotics. Take medications as prescribed. Check labs today. Given and reviewed copy of ACP documents from [place] Secretary of State and encouraged to complete and return 

## 2016-10-30 NOTE — Assessment & Plan Note (Signed)
Well controlled, no changes to meds. Encouraged heart healthy diet such as the DASH diet and exercise as tolerated. Notes some hoarseness and would like to try coming off of Lisinopril will switch to Amlodipine 5 mg daily

## 2016-10-30 NOTE — Assessment & Plan Note (Signed)
Feels well will titrate off the Wellbutrin drop to 150 mg daily x 1 month and then stop as tolerated

## 2016-10-30 NOTE — Assessment & Plan Note (Signed)
Likely related to spironolactone but will check UA and culture with sensitivities

## 2016-10-30 NOTE — Assessment & Plan Note (Signed)
Continues to abstain 

## 2016-10-30 NOTE — Progress Notes (Signed)
Pre visit review using our clinic review tool, if applicable. No additional management support is needed unless otherwise documented below in the visit note. 

## 2016-10-30 NOTE — Assessment & Plan Note (Signed)
hgba1c acceptable, minimize simple carbs. Increase exercise as tolerated.  

## 2016-10-30 NOTE — Patient Instructions (Signed)
Preventive Care 67 Years and Older, Female Preventive care refers to lifestyle choices and visits with your health care provider that can promote health and wellness. What does preventive care include?  A yearly physical exam. This is also called an annual well check.  Dental exams once or twice a year.  Routine eye exams. Ask your health care provider how often you should have your eyes checked.  Personal lifestyle choices, including:  Daily care of your teeth and gums.  Regular physical activity.  Eating a healthy diet.  Avoiding tobacco and drug use.  Limiting alcohol use.  Practicing safe sex.  Taking low-dose aspirin every day.  Taking vitamin and mineral supplements as recommended by your health care provider. What happens during an annual well check? The services and screenings done by your health care provider during your annual well check will depend on your age, overall health, lifestyle risk factors, and family history of disease. Counseling  Your health care provider may ask you questions about your:  Alcohol use.  Tobacco use.  Drug use.  Emotional well-being.  Home and relationship well-being.  Sexual activity.  Eating habits.  History of falls.  Memory and ability to understand (cognition).  Work and work environment.  Reproductive health. Screening  You may have the following tests or measurements:  Height, weight, and BMI.  Blood pressure.  Lipid and cholesterol levels. These may be checked every 5 years, or more frequently if you are over 50 years old.  Skin check.  Lung cancer screening. You may have this screening every year starting at age 55 if you have a 30-pack-year history of smoking and currently smoke or have quit within the past 15 years.  Fecal occult blood test (FOBT) of the stool. You may have this test every year starting at age 50.  Flexible sigmoidoscopy or colonoscopy. You may have a sigmoidoscopy every 5 years or  a colonoscopy every 10 years starting at age 50.  Hepatitis C blood test.  Hepatitis B blood test.  Sexually transmitted disease (STD) testing.  Diabetes screening. This is done by checking your blood sugar (glucose) after you have not eaten for a while (fasting). You may have this done every 1-3 years.  Bone density scan. This is done to screen for osteoporosis. You may have this done starting at age 67.  Mammogram. This may be done every 1-2 years. Talk to your health care provider about how often you should have regular mammograms. Talk with your health care provider about your test results, treatment options, and if necessary, the need for more tests. Vaccines  Your health care provider may recommend certain vaccines, such as:  Influenza vaccine. This is recommended every year.  Tetanus, diphtheria, and acellular pertussis (Tdap, Td) vaccine. You may need a Td booster every 10 years.  Varicella vaccine. You may need this if you have not been vaccinated.  Zoster vaccine. You may need this after age 60.  Measles, mumps, and rubella (MMR) vaccine. You may need at least one dose of MMR if you were born in 1957 or later. You may also need a second dose.  Pneumococcal 13-valent conjugate (PCV13) vaccine. One dose is recommended after age 67.  Pneumococcal polysaccharide (PPSV23) vaccine. One dose is recommended after age 67.  Meningococcal vaccine. You may need this if you have certain conditions.  Hepatitis A vaccine. You may need this if you have certain conditions or if you travel or work in places where you may be exposed to   hepatitis A.  Hepatitis B vaccine. You may need this if you have certain conditions or if you travel or work in places where you may be exposed to hepatitis B.  Haemophilus influenzae type b (Hib) vaccine. You may need this if you have certain conditions. Talk to your health care provider about which screenings and vaccines you need and how often you need  them. This information is not intended to replace advice given to you by your health care provider. Make sure you discuss any questions you have with your health care provider. Document Released: 12/23/2015 Document Revised: 08/15/2016 Document Reviewed: 09/27/2015 Elsevier Interactive Patient Education  2017 Elsevier Inc.  

## 2016-10-31 ENCOUNTER — Other Ambulatory Visit (INDEPENDENT_AMBULATORY_CARE_PROVIDER_SITE_OTHER): Payer: Medicare Other

## 2016-10-31 DIAGNOSIS — E875 Hyperkalemia: Secondary | ICD-10-CM

## 2016-10-31 LAB — COMPREHENSIVE METABOLIC PANEL
ALT: 19 U/L (ref 0–53)
AST: 15 U/L (ref 0–37)
Albumin: 4.2 g/dL (ref 3.5–5.2)
Alkaline Phosphatase: 60 U/L (ref 39–117)
BUN: 30 mg/dL — ABNORMAL HIGH (ref 6–23)
CALCIUM: 10 mg/dL (ref 8.4–10.5)
CHLORIDE: 99 meq/L (ref 96–112)
CO2: 26 meq/L (ref 19–32)
Creatinine, Ser: 1.57 mg/dL — ABNORMAL HIGH (ref 0.40–1.50)
GFR: 47.08 mL/min — AB (ref >60.00)
GLUCOSE: 121 mg/dL — AB (ref 70–99)
POTASSIUM: 5.3 meq/L — AB (ref 3.5–5.1)
Sodium: 133 mEq/L — ABNORMAL LOW (ref 135–145)
Total Bilirubin: 0.4 mg/dL (ref 0.2–1.2)
Total Protein: 6.7 g/dL (ref 6.0–8.3)

## 2016-10-31 LAB — URINE CULTURE: Organism ID, Bacteria: NO GROWTH

## 2016-11-05 ENCOUNTER — Other Ambulatory Visit: Payer: Self-pay | Admitting: *Deleted

## 2016-11-05 DIAGNOSIS — E875 Hyperkalemia: Secondary | ICD-10-CM

## 2016-11-06 ENCOUNTER — Other Ambulatory Visit (INDEPENDENT_AMBULATORY_CARE_PROVIDER_SITE_OTHER): Payer: Medicare Other

## 2016-11-06 ENCOUNTER — Other Ambulatory Visit: Payer: Self-pay | Admitting: Family Medicine

## 2016-11-06 DIAGNOSIS — E875 Hyperkalemia: Secondary | ICD-10-CM | POA: Diagnosis not present

## 2016-11-06 DIAGNOSIS — E876 Hypokalemia: Secondary | ICD-10-CM

## 2016-11-06 LAB — COMPREHENSIVE METABOLIC PANEL
ALBUMIN: 4.3 g/dL (ref 3.5–5.2)
ALT: 18 U/L (ref 0–53)
AST: 16 U/L (ref 0–37)
Alkaline Phosphatase: 61 U/L (ref 39–117)
BUN: 25 mg/dL — ABNORMAL HIGH (ref 6–23)
CALCIUM: 10.1 mg/dL (ref 8.4–10.5)
CHLORIDE: 101 meq/L (ref 96–112)
CO2: 29 mEq/L (ref 19–32)
CREATININE: 1.3 mg/dL (ref 0.40–1.50)
GFR: 58.53 mL/min — ABNORMAL LOW (ref >60.00)
Glucose, Bld: 104 mg/dL — ABNORMAL HIGH (ref 70–99)
POTASSIUM: 5.4 meq/L — AB (ref 3.5–5.1)
Sodium: 136 mEq/L (ref 135–145)
Total Bilirubin: 0.4 mg/dL (ref 0.2–1.2)
Total Protein: 6.9 g/dL (ref 6.0–8.3)

## 2016-11-19 ENCOUNTER — Other Ambulatory Visit (INDEPENDENT_AMBULATORY_CARE_PROVIDER_SITE_OTHER): Payer: Medicare Other

## 2016-11-19 DIAGNOSIS — E876 Hypokalemia: Secondary | ICD-10-CM

## 2016-11-19 LAB — COMPREHENSIVE METABOLIC PANEL
ALBUMIN: 4.3 g/dL (ref 3.5–5.2)
ALK PHOS: 60 U/L (ref 39–117)
ALT: 19 U/L (ref 0–53)
AST: 14 U/L (ref 0–37)
BILIRUBIN TOTAL: 0.4 mg/dL (ref 0.2–1.2)
BUN: 23 mg/dL (ref 6–23)
CALCIUM: 9.9 mg/dL (ref 8.4–10.5)
CHLORIDE: 101 meq/L (ref 96–112)
CO2: 27 mEq/L (ref 19–32)
CREATININE: 1.22 mg/dL (ref 0.40–1.50)
GFR: 62.97 mL/min (ref >60.00)
Glucose, Bld: 103 mg/dL — ABNORMAL HIGH (ref 70–99)
Potassium: 5.4 mEq/L — ABNORMAL HIGH (ref 3.5–5.1)
Sodium: 134 mEq/L — ABNORMAL LOW (ref 135–145)
TOTAL PROTEIN: 7 g/dL (ref 6.0–8.3)

## 2016-11-28 DIAGNOSIS — E875 Hyperkalemia: Secondary | ICD-10-CM | POA: Diagnosis not present

## 2016-12-10 HISTORY — PX: COLONOSCOPY: SHX174

## 2017-01-08 ENCOUNTER — Encounter: Payer: Self-pay | Admitting: Internal Medicine

## 2017-02-11 ENCOUNTER — Ambulatory Visit (INDEPENDENT_AMBULATORY_CARE_PROVIDER_SITE_OTHER): Payer: PPO | Admitting: Family Medicine

## 2017-02-11 ENCOUNTER — Encounter: Payer: Self-pay | Admitting: Family Medicine

## 2017-02-11 VITALS — BP 132/96 | HR 72 | Temp 98.0°F | Resp 18 | Wt 198.6 lb

## 2017-02-11 DIAGNOSIS — E782 Mixed hyperlipidemia: Secondary | ICD-10-CM

## 2017-02-11 DIAGNOSIS — F17200 Nicotine dependence, unspecified, uncomplicated: Secondary | ICD-10-CM | POA: Diagnosis not present

## 2017-02-11 DIAGNOSIS — E875 Hyperkalemia: Secondary | ICD-10-CM

## 2017-02-11 DIAGNOSIS — R739 Hyperglycemia, unspecified: Secondary | ICD-10-CM

## 2017-02-11 DIAGNOSIS — I10 Essential (primary) hypertension: Secondary | ICD-10-CM

## 2017-02-11 DIAGNOSIS — Z0000 Encounter for general adult medical examination without abnormal findings: Secondary | ICD-10-CM

## 2017-02-11 DIAGNOSIS — F640 Transsexualism: Secondary | ICD-10-CM

## 2017-02-11 DIAGNOSIS — Z789 Other specified health status: Secondary | ICD-10-CM

## 2017-02-11 HISTORY — DX: Hyperkalemia: E87.5

## 2017-02-11 LAB — LIPID PANEL
CHOL/HDL RATIO: 3
Cholesterol: 195 mg/dL (ref 0–200)
HDL: 67.3 mg/dL (ref >39.00)
LDL Cholesterol: 98 mg/dL (ref 0–99)
NONHDL: 127.84
Triglycerides: 151 mg/dL — ABNORMAL HIGH (ref 0.0–149.0)
VLDL: 30.2 mg/dL (ref 0.0–40.0)

## 2017-02-11 LAB — CBC
HCT: 43.9 % (ref 39.0–52.0)
Hemoglobin: 15.2 g/dL (ref 13.0–17.0)
MCHC: 34.7 g/dL (ref 30.0–36.0)
MCV: 96.2 fl (ref 78.0–100.0)
Platelets: 325 10*3/uL (ref 150.0–400.0)
RBC: 4.56 Mil/uL (ref 4.22–5.81)
RDW: 12.5 % (ref 11.5–15.5)
WBC: 9.1 10*3/uL (ref 4.0–10.5)

## 2017-02-11 LAB — HEMOGLOBIN A1C: HEMOGLOBIN A1C: 5.8 % (ref 4.6–6.5)

## 2017-02-11 LAB — COMPREHENSIVE METABOLIC PANEL
ALT: 27 U/L (ref 0–53)
AST: 21 U/L (ref 0–37)
Albumin: 4.5 g/dL (ref 3.5–5.2)
Alkaline Phosphatase: 66 U/L (ref 39–117)
BILIRUBIN TOTAL: 0.5 mg/dL (ref 0.2–1.2)
BUN: 15 mg/dL (ref 6–23)
CO2: 26 meq/L (ref 19–32)
Calcium: 9.8 mg/dL (ref 8.4–10.5)
Chloride: 101 mEq/L (ref 96–112)
Creatinine, Ser: 1.08 mg/dL (ref 0.40–1.50)
GFR: 72.43 mL/min (ref >60.00)
GLUCOSE: 104 mg/dL — AB (ref 70–99)
Potassium: 4.6 mEq/L (ref 3.5–5.1)
Sodium: 136 mEq/L (ref 135–145)
Total Protein: 7.2 g/dL (ref 6.0–8.3)

## 2017-02-11 LAB — TSH: TSH: 3.78 u[IU]/mL (ref 0.35–4.50)

## 2017-02-11 NOTE — Assessment & Plan Note (Signed)
Encouraged heart healthy diet, increase exercise, avoid trans fats, consider a krill oil cap daily 

## 2017-02-11 NOTE — Progress Notes (Signed)
Pre visit review using our clinic review tool, if applicable. No additional management support is needed unless otherwise documented below in the visit note. 

## 2017-02-11 NOTE — Assessment & Plan Note (Addendum)
Not controlled, no changes to meds but decrease sodium and caffeine, RN BP check in 2 weeks. Encouraged heart healthy diet such as the DASH diet and exercise as tolerated.

## 2017-02-11 NOTE — Progress Notes (Signed)
Subjective:  I acted as a Education administrator for Dr. Charlett Blake. [name], [place]    Patient ID: Taylor Newman, female    DOB: 18-Dec-1950, 66 y.o.   MRN: [id number]  Chief Complaint  Patient presents with  . Follow-up  . Hypertension  . Hyperglycemia    Hypertension  This is a chronic problem. The problem is unchanged. Associated symptoms include malaise/fatigue. Pertinent negatives include no blurred vision, chest pain, headaches, palpitations or shortness of breath.  Hyperglycemia  Pertinent negatives include no chest pain, congestion, coughing, fever, headaches, rash or vomiting.    Patient is in today for a 3 month follow up on hypertension, hyperglycemia and other medical conditions. Patient has no acute concerns. She is doing well. Is frustrated with weight gain and fatigue since starting the estrogen. Still not smoking since June. Denies CP/palp/SOB/HA/congestion/fevers/GI or GU c/o. Taking meds as prescribed  Patient Care Team: Mosie Lukes, MD as PCP - General (Family Medicine) Linward Natal, MD as Consulting Physician (Obstetrics and Gynecology)   Past Medical History:  Diagnosis Date  . Chest pain 01/12/2013  . FHx: BRCA2 gene positive 02/20/2015   Sister with this and breast cancer.  . Grief reaction 07/15/2013  . Heart murmur   . History of chicken pox   . History of colon polyps   . Hyperkalemia 02/11/2017  . Hyperlipidemia   . Hypertension   . Laceration of hand 10/24/2014  . Female-to-female transgender person 10/30/2016  . Female-to-female transgender person 10/30/2016   Sees endocrinology Dr Linward Natal at [facility] in [place]  . Preventative health care 08/13/2014  . Sun-damaged skin 01/13/2014  . Tobacco use disorder 01/13/2014    Past Surgical History:  Procedure Laterality Date  . COLONOSCOPY    . TONSILLECTOMY  1954    Family History  Problem Relation Age of Onset  . Colon cancer Father 31  . Hyperlipidemia Father   . Heart disease Father    stent placement  . Hypertension Father   . Cancer Father 33    colon with liver mets  . Diabetes Father   . Breast cancer Sister     BRCA2 positive  . Cancer Sister     twice, breast cancer, BRCA 2 gene mutation  . Hyperlipidemia Mother   . Alzheimer's disease Mother   . Hypertension Mother   . Diabetes Maternal Grandmother     father  . Cancer Son 38  . Colon cancer Son     Joslyn Hy - not biologically related  . Mental illness Son     bipolar  . Allergies Sister   . Breast cancer Paternal Aunt     2 paternal aunts with breast cancer postmenopausal  . Breast cancer Cousin     3 pat female first cousins with breast cancer  . Breast cancer Other     niece with breast cancer  . Prostate cancer Neg Hx     Social History   Social History  . Marital status: Married    Spouse name: N/A  . Number of children: N/A  . Years of education: N/A   Occupational History  . Not on file.   Social History Main Topics  . Smoking status: Former Smoker    Packs/day: 1.00    Years: 45.00    Types: Cigarettes    Quit date: 05/14/2016  . Smokeless tobacco: Never Used     Comment: 1/2  ppd  . Alcohol use 10.8 oz/week    18 Cans  of beer per week  . Drug use: No  . Sexual activity: Not on file   Other Topics Concern  . Not on file   Social History Narrative  . No narrative on file    Outpatient Medications Prior to Visit  Medication Sig Dispense Refill  . amLODipine (NORVASC) 5 MG tablet Take 1 tablet (5 mg total) by mouth daily. 90 tablet 3  . aspirin 81 MG chewable tablet Chew 81 mg by mouth daily.    Marland Kitchen atorvastatin (LIPITOR) 20 MG tablet TAKE 1 TABLET (20 MG) BY MOUTH DAILY. 90 tablet 1  . estradiol (ESTRACE) 2 MG tablet Take 1 tablet by mouth daily.    Marland Kitchen KRILL OIL PO Take 1 capsule by mouth daily.    Marland Kitchen lactobacillus acidophilus (BACID) TABS tablet Take 1 tablet by mouth daily.    . Multiple Vitamin (MULTIVITAMIN) tablet Take 1 tablet by mouth daily.    . psyllium (REGULOID)  0.52 G capsule Take 0.52 g by mouth daily.    Marland Kitchen spironolactone (ALDACTONE) 100 MG tablet Take 50 mg by mouth once.     Marland Kitchen buPROPion (WELLBUTRIN XL) 150 MG 24 hr tablet Take 1 tablet (150 mg total) by mouth daily. (Patient not taking: Reported on 02/11/2017) 60 tablet 0  . lisinopril (PRINIVIL,ZESTRIL) 20 MG tablet TAKE 1 TABLET (20 MG) BY MOUTH DAILY. (Patient not taking: Reported on 02/11/2017) 90 tablet 1   No facility-administered medications prior to visit.     Allergies  Allergen Reactions  . Lisinopril Cough    hoarseness    Review of Systems  Constitutional: Positive for malaise/fatigue. Negative for fever and weight loss.  HENT: Negative for congestion.   Eyes: Negative for blurred vision.  Respiratory: Negative for cough and shortness of breath.   Cardiovascular: Negative for chest pain, palpitations and leg swelling.  Gastrointestinal: Negative for vomiting.  Musculoskeletal: Negative for back pain.  Skin: Negative for rash.  Neurological: Negative for loss of consciousness and headaches.       Objective:    Physical Exam  Constitutional: He is oriented to person, place, and time. He appears well-developed and well-nourished. No distress.  HENT:  Head: Normocephalic and atraumatic.  Eyes: Conjunctivae are normal.  Neck: Normal range of motion. No thyromegaly present.  Cardiovascular: Normal rate and regular rhythm.   Pulmonary/Chest: Effort normal and breath sounds normal. He has no wheezes.  Abdominal: Soft. Bowel sounds are normal. There is no tenderness.  Musculoskeletal: Normal range of motion. He exhibits no edema or deformity.  Neurological: He is alert and oriented to person, place, and time.  Skin: Skin is warm and dry. He is not diaphoretic.  Psychiatric: He has a normal mood and affect.    BP (!) 132/96   Pulse 72   Temp 98 F (36.7 C) (Oral)   Resp 18   Wt 198 lb 9.6 oz (90.1 kg)   SpO2 98%   BMI 29.76 kg/m  Wt Readings from Last 3 Encounters:    02/11/17 198 lb 9.6 oz (90.1 kg)  10/30/16 181 lb 6.4 oz (82.3 kg)  02/20/16 182 lb (82.6 kg)     Lab Results  Component Value Date   WBC 9.9 10/30/2016   HGB 13.7 10/30/2016   HCT 40.7 10/30/2016   PLT 380.0 10/30/2016   GLUCOSE 103 (H) 11/19/2016   CHOL 200 10/30/2016   TRIG 164.0 (H) 10/30/2016   HDL 68.10 10/30/2016   LDLCALC 99 10/30/2016   ALT 19 11/19/2016  AST 14 11/19/2016   NA 134 (L) 11/19/2016   K 5.4 (H) 11/19/2016   CL 101 11/19/2016   CREATININE 1.22 11/19/2016   BUN 23 11/19/2016   CO2 27 11/19/2016   TSH 3.54 10/30/2016   PSA 0.39 10/30/2016   HGBA1C 5.6 10/30/2016   MICROALBUR <0.7 08/19/2015    Lab Results  Component Value Date   TSH 3.54 10/30/2016   Lab Results  Component Value Date   WBC 9.9 10/30/2016   HGB 13.7 10/30/2016   HCT 40.7 10/30/2016   MCV 100.4 (H) 10/30/2016   PLT 380.0 10/30/2016   Lab Results  Component Value Date   NA 134 (L) 11/19/2016   K 5.4 (H) 11/19/2016   CO2 27 11/19/2016   GLUCOSE 103 (H) 11/19/2016   BUN 23 11/19/2016   CREATININE 1.22 11/19/2016   BILITOT 0.4 11/19/2016   ALKPHOS 60 11/19/2016   AST 14 11/19/2016   ALT 19 11/19/2016   PROT 7.0 11/19/2016   ALBUMIN 4.3 11/19/2016   CALCIUM 9.9 11/19/2016   GFR 62.97 11/19/2016   Lab Results  Component Value Date   CHOL 200 10/30/2016   Lab Results  Component Value Date   HDL 68.10 10/30/2016   Lab Results  Component Value Date   LDLCALC 99 10/30/2016   Lab Results  Component Value Date   TRIG 164.0 (H) 10/30/2016   Lab Results  Component Value Date   CHOLHDL 3 10/30/2016   Lab Results  Component Value Date   HGBA1C 5.6 10/30/2016       Assessment & Plan:   Problem List Items Addressed This Visit    Hyperlipidemia    Encouraged heart healthy diet, increase exercise, avoid trans fats, consider a krill oil cap daily      Relevant Orders   Lipid panel   HTN (hypertension)    Not controlled, no changes to meds but decrease  sodium and caffeine, RN BP check in 2 weeks. Encouraged heart healthy diet such as the DASH diet and exercise as tolerated.       Relevant Orders   CBC   Comprehensive metabolic panel   TSH   Hyperglycemia    hgba1c acceptable, minimize simple carbs. Increase exercise as tolerated      Relevant Orders   Hemoglobin A1c   Tobacco use disorder    Still not smoking since 05/30/2016      Female-to-female transgender person    Doing well but frustrated with weight gain is encouraged to try DASH diet or MIND diet. Is following with endocrinology Dr Vella Raring, may want to change endocrinologists moving forward      Hyperkalemia    Recheck today, Spironolactone dropped to 50 mg daily       Other Visit Diagnoses    Encounter for Medicare annual wellness exam    -  Primary      I have discontinued Mr. Mclucas lisinopril and buPROPion. I am also having him maintain his aspirin, KRILL OIL PO, lactobacillus acidophilus, multivitamin, psyllium, atorvastatin, spironolactone, estradiol, amLODipine, MELATONIN PO, and Biotin.  Meds ordered this encounter  Medications  . MELATONIN PO    Sig: Take by mouth at bedtime as needed.  . Biotin 5000 MCG TABS    Sig: Take 1 tablet by mouth daily.    CMA served as Education administrator during this visit. History, Physical and Plan performed by medical provider. Documentation and orders reviewed and attested to.  Penni Homans, MD

## 2017-02-11 NOTE — Patient Instructions (Signed)
Renato Shin, MD DASH Eating Plan vs MIND plan DASH stands for "Dietary Approaches to Stop Hypertension." The DASH eating plan is a healthy eating plan that has been shown to reduce high blood pressure (hypertension). It may also reduce your risk for type 2 diabetes, heart disease, and stroke. The DASH eating plan may also help with weight loss. What are tips for following this plan? General guidelines   Avoid eating more than 2,300 mg (milligrams) of salt (sodium) a day. If you have hypertension, you may need to reduce your sodium intake to 1,500 mg a day.  Limit alcohol intake to no more than 1 drink a day for nonpregnant women and 2 drinks a day for men. One drink equals 12 oz of beer, 5 oz of wine, or 1 oz of hard liquor.  Work with your health care provider to maintain a healthy body weight or to lose weight. Ask what an ideal weight is for you.  Get at least 30 minutes of exercise that causes your heart to beat faster (aerobic exercise) most days of the week. Activities may include walking, swimming, or biking.  Work with your health care provider or diet and nutrition specialist (dietitian) to adjust your eating plan to your individual calorie needs. Reading food labels   Check food labels for the amount of sodium per serving. Choose foods with less than 5 percent of the Daily Value of sodium. Generally, foods with less than 300 mg of sodium per serving fit into this eating plan.  To find whole grains, look for the word "whole" as the first word in the ingredient list. Shopping   Buy products labeled as "low-sodium" or "no salt added."  Buy fresh foods. Avoid canned foods and premade or frozen meals. Cooking   Avoid adding salt when cooking. Use salt-free seasonings or herbs instead of table salt or sea salt. Check with your health care provider or pharmacist before using salt substitutes.  Do not fry foods. Cook foods using healthy methods such as baking, boiling, grilling, and  broiling instead.  Cook with heart-healthy oils, such as olive, canola, soybean, or sunflower oil. Meal planning    Eat a balanced diet that includes:  5 or more servings of fruits and vegetables each day. At each meal, try to fill half of your plate with fruits and vegetables.  Up to 6-8 servings of whole grains each day.  Less than 6 oz of lean meat, poultry, or fish each day. A 3-oz serving of meat is about the same size as a deck of cards. One egg equals 1 oz.  2 servings of low-fat dairy each day.  A serving of nuts, seeds, or beans 5 times each week.  Heart-healthy fats. Healthy fats called Omega-3 fatty acids are found in foods such as flaxseeds and coldwater fish, like sardines, salmon, and mackerel.  Limit how much you eat of the following:  Canned or prepackaged foods.  Food that is high in trans fat, such as fried foods.  Food that is high in saturated fat, such as fatty meat.  Sweets, desserts, sugary drinks, and other foods with added sugar.  Full-fat dairy products.  Do not salt foods before eating.  Try to eat at least 2 vegetarian meals each week.  Eat more home-cooked food and less restaurant, buffet, and fast food.  When eating at a restaurant, ask that your food be prepared with less salt or no salt, if possible. What foods are recommended? The items listed may  not be a complete list. Talk with your dietitian about what dietary choices are best for you. Grains  Whole-grain or whole-wheat bread. Whole-grain or whole-wheat pasta. Brown rice. Modena Morrow. Bulgur. Whole-grain and low-sodium cereals. Pita bread. Low-fat, low-sodium crackers. Whole-wheat flour tortillas. Vegetables  Fresh or frozen vegetables (raw, steamed, roasted, or grilled). Low-sodium or reduced-sodium tomato and vegetable juice. Low-sodium or reduced-sodium tomato sauce and tomato paste. Low-sodium or reduced-sodium canned vegetables. Fruits  All fresh, dried, or frozen fruit.  Canned fruit in natural juice (without added sugar). Meat and other protein foods  Skinless chicken or [place]. Ground chicken or [place]. Pork with fat trimmed off. Fish and seafood. Egg whites. Dried beans, peas, or lentils. Unsalted nuts, nut butters, and seeds. Unsalted canned beans. Lean cuts of beef with fat trimmed off. Low-sodium, lean deli meat. Dairy  Low-fat (1%) or fat-free (skim) milk. Fat-free, low-fat, or reduced-fat cheeses. Nonfat, low-sodium ricotta or cottage cheese. Low-fat or nonfat yogurt. Low-fat, low-sodium cheese. Fats and oils  Soft margarine without trans fats. Vegetable oil. Low-fat, reduced-fat, or light mayonnaise and salad dressings (reduced-sodium). Canola, safflower, olive, soybean, and sunflower oils. Avocado. Seasoning and other foods  Herbs. Spices. Seasoning mixes without salt. Unsalted popcorn and pretzels. Fat-free sweets. What foods are not recommended? The items listed may not be a complete list. Talk with your dietitian about what dietary choices are best for you. Grains  Baked goods made with fat, such as croissants, muffins, or some breads. Dry pasta or rice meal packs. Vegetables  Creamed or fried vegetables. Vegetables in a cheese sauce. Regular canned vegetables (not low-sodium or reduced-sodium). Regular canned tomato sauce and paste (not low-sodium or reduced-sodium). Regular tomato and vegetable juice (not low-sodium or reduced-sodium). Angie Fava. Olives. Fruits  Canned fruit in a light or heavy syrup. Fried fruit. Fruit in cream or butter sauce. Meat and other protein foods  Fatty cuts of meat. Ribs. Fried meat. Berniece Salines. Sausage. Bologna and other processed lunch meats. Salami. Fatback. Hotdogs. Bratwurst. Salted nuts and seeds. Canned beans with added salt. Canned or smoked fish. Whole eggs or egg yolks. Chicken or [place] with skin. Dairy  Whole or 2% milk, cream, and half-and-half. Whole or full-fat cream cheese. Whole-fat or sweetened yogurt.  Full-fat cheese. Nondairy creamers. Whipped toppings. Processed cheese and cheese spreads. Fats and oils  Butter. Stick margarine. Lard. Shortening. Ghee. Bacon fat. Tropical oils, such as coconut, palm kernel, or palm oil. Seasoning and other foods  Salted popcorn and pretzels. Onion salt, garlic salt, seasoned salt, table salt, and sea salt. Worcestershire sauce. Tartar sauce. Barbecue sauce. Teriyaki sauce. Soy sauce, including reduced-sodium. Steak sauce. Canned and packaged gravies. Fish sauce. Oyster sauce. Cocktail sauce. Horseradish that you find on the shelf. Ketchup. Mustard. Meat flavorings and tenderizers. Bouillon cubes. Hot sauce and Tabasco sauce. Premade or packaged marinades. Premade or packaged taco seasonings. Relishes. Regular salad dressings. Where to find more information:  National Heart, Lung, and [place]: https://wilson-eaton.com/  American Heart Association: www.heart.org Summary  The DASH eating plan is a healthy eating plan that has been shown to reduce high blood pressure (hypertension). It may also reduce your risk for type 2 diabetes, heart disease, and stroke.  With the DASH eating plan, you should limit salt (sodium) intake to 2,300 mg a day. If you have hypertension, you may need to reduce your sodium intake to 1,500 mg a day.  When on the DASH eating plan, aim to eat more fresh fruits and vegetables, whole grains, lean proteins, low-fat  dairy, and heart-healthy fats.  Work with your health care provider or diet and nutrition specialist (dietitian) to adjust your eating plan to your individual calorie needs. This information is not intended to replace advice given to you by your health care provider. Make sure you discuss any questions you have with your health care provider. Document Released: 11/15/2011 Document Revised: 11/19/2016 Document Reviewed: 11/19/2016 Elsevier Interactive Patient Education  2017 [employer].

## 2017-02-11 NOTE — Assessment & Plan Note (Addendum)
Doing well but frustrated with weight gain is encouraged to try DASH diet or MIND diet. Is following with endocrinology Dr Vella Raring, may want to change endocrinologists moving forward

## 2017-02-11 NOTE — Assessment & Plan Note (Signed)
Recheck today, Spironolactone dropped to 50 mg daily

## 2017-02-11 NOTE — Progress Notes (Addendum)
Subjective:   Taylor Newman is a 66 y.o. female who presents for an Initial Medicare Annual Wellness Visit.  Pt recently transitioned from female to female and is doing well. She has gained about 17 lbs since last visit, which she feels is a combination of quitting smoking + hormone therapy + dietary indiscretion.  Pt is meeting w/ an attorney this week to create/update advance directives.  Review of Systems    No ROS.  Medicare Wellness Visit.  Cardiac Risk Factors include: advanced age (>43mn, >>21women);hypertension;dyslipidemia  Sleep patterns: no sleep issues, feels rested on waking, gets up 0-3 times nightly to void and sleeps 6 hours nightly. Sometimes has trouble falling asleep; uses melatonin PRN. Home Safety/Smoke Alarms: Feels safe in home. Smoke alarms in place. Carbon monoxide detectors.  Living environment; residence and Firearm Safety: Lives alone on ground floor of apartment, no firearms. Seat Belt Safety/Bike Helmet: Wears seat belt.   Counseling:   [facility] Dental- Dr. BHoover Brownsevery 4 months. Partial plate lower.  Female:   CCS- last 02/11/12. 1 polyp removed, ulcerated diverticulum, diverticulosis, perianal skin tag. 5 year recall.   PSA-  Lab Results  Component Value Date   PSA 0.39 10/30/2016   PSA 0.26 08/19/2015   PSA 0.30 08/10/2014       Objective:    Today's Vitals   02/11/17 0747 02/11/17 0816  BP: (!) 142/96 (!) 132/96  Pulse: 72   Resp: 18   Temp: 98 F (36.7 C)   TempSrc: Oral   SpO2: 98%   Weight: 198 lb 9.6 oz (90.1 kg)    Body mass index is 29.76 kg/m.  Wt Readings from Last 3 Encounters:  02/11/17 198 lb 9.6 oz (90.1 kg)  10/30/16 181 lb 6.4 oz (82.3 kg)  02/20/16 182 lb (82.6 kg)    Current Medications (verified) Outpatient Encounter Prescriptions as of 02/11/2017  Medication Sig  . amLODipine (NORVASC) 5 MG tablet Take 1 tablet (5 mg total) by mouth daily.  .Marland Kitchenaspirin 81 MG chewable  tablet Chew 81 mg by mouth daily.  .Marland Kitchenatorvastatin (LIPITOR) 20 MG tablet TAKE 1 TABLET (20 MG) BY MOUTH DAILY.  .Marland KitchenBiotin 5000 MCG TABS Take 1 tablet by mouth daily.  .Marland Kitchenestradiol (ESTRACE) 2 MG tablet Take 1 tablet by mouth daily.  .Marland KitchenKRILL OIL PO Take 1 capsule by mouth daily.  .Marland Kitchenlactobacillus acidophilus (BACID) TABS tablet Take 1 tablet by mouth daily.  .Marland KitchenMELATONIN PO Take by mouth at bedtime as needed.  . Multiple Vitamin (MULTIVITAMIN) tablet Take 1 tablet by mouth daily.  . psyllium (REGULOID) 0.52 G capsule Take 0.52 g by mouth daily.  .Marland Kitchenspironolactone (ALDACTONE) 100 MG tablet Take 50 mg by mouth once.   . [DISCONTINUED] buPROPion (WELLBUTRIN XL) 150 MG 24 hr tablet Take 1 tablet (150 mg total) by mouth daily. (Patient not taking: Reported on 02/11/2017)  . [DISCONTINUED] lisinopril (PRINIVIL,ZESTRIL) 20 MG tablet TAKE 1 TABLET (20 MG) BY MOUTH DAILY. (Patient not taking: Reported on 02/11/2017)   No facility-administered encounter medications on file as of 02/11/2017.     Allergies (verified) Lisinopril   History: Past Medical History:  Diagnosis Date  . Chest pain 01/12/2013  . FHx: BRCA2 gene positive 02/20/2015   Sister with this and breast cancer.  . Grief reaction 07/15/2013  . Heart murmur   . History of chicken pox   . History of colon polyps   .  Hyperkalemia 02/11/2017  . Hyperlipidemia   . Hypertension   . Laceration of hand 10/24/2014  . Female-to-female transgender person 10/30/2016  . Female-to-female transgender person 10/30/2016   Sees endocrinology Dr Linward Natal at [facility] in [place]  . Preventative health care 08/13/2014  . Sun-damaged skin 01/13/2014  . Tobacco use disorder 01/13/2014   Past Surgical History:  Procedure Laterality Date  . COLONOSCOPY    . TONSILLECTOMY  1954   Family History  Problem Relation Age of Onset  . Colon cancer Father 79  . Hyperlipidemia Father   . Heart disease Father     stent placement  . Hypertension  Father   . Cancer Father 39    colon with liver mets  . Diabetes Father   . Breast cancer Sister     BRCA2 positive  . Cancer Sister     twice, breast cancer, BRCA 2 gene mutation  . Hyperlipidemia Mother   . Alzheimer's disease Mother   . Hypertension Mother   . Diabetes Maternal Grandmother     father  . Cancer Son 26  . Colon cancer Son     Joslyn Hy - not biologically related  . Mental illness Son     bipolar  . Allergies Sister   . Breast cancer Paternal Aunt     2 paternal aunts with breast cancer postmenopausal  . Breast cancer Cousin     3 pat female first cousins with breast cancer  . Breast cancer Other     niece with breast cancer  . Prostate cancer Neg Hx    Social History   Occupational History  . Not on file.   Social History Main Topics  . Smoking status: Former Smoker    Packs/day: 1.00    Years: 45.00    Types: Cigarettes    Quit date: 05/14/2016  . Smokeless tobacco: Never Used     Comment: 1/2  ppd  . Alcohol use 10.8 oz/week    18 Cans of beer per week  . Drug use: No  . Sexual activity: Not on file    Tobacco Counseling Counseling given: Not Answered   Activities of Daily Living In your present state of health, do you have any difficulty performing the following activities: 02/11/2017 02/20/2016  Hearing? N N  Vision? N N  Difficulty concentrating or making decisions? N N  Walking or climbing stairs? N N  Dressing or bathing? N N  Doing errands, shopping? N N  Preparing Food and eating ? N -  Using the Toilet? N -  Managing your Medications? N -  Managing your Finances? N -  Housekeeping or managing your Housekeeping? N -  Some recent data might be hidden    Immunizations and Health Maintenance Immunization History  Administered Date(s) Administered  . Influenza-Unspecified 10/08/2013, 09/20/2014, 09/10/2015, 09/10/2016  . Pneumococcal Conjugate-13 02/11/2015, 10/30/2016  . Td 05/10/2013  . Tdap 11/03/2007  . Zoster 09/25/2010    Health Maintenance Due  Topic Date Due  . COLONOSCOPY  02/10/2017    Patient Care Team: Mosie Lukes, MD as PCP - General (Family Medicine) Linward Natal, MD as Consulting Physician (Obstetrics and Gynecology)  Indicate any recent Medical Services you may have received from other than [facility] providers in the past year (date may be approximate).     Assessment:   This is a routine wellness examination for Elberta Fortis. Physical assessment deferred to PCP.  Hearing/Vision screen Hearing Screening Comments: Able to hear conversational tones  w/o difficulty. No issues reported.  Vision Screening Comments: No issues reported. Wearing glasses. Follows w/ eye doctor every 2 years.  Dietary issues and exercise activities discussed: Current Exercise Habits: The patient does not participate in regular exercise at present  Diet (meal preparation, eat out, water intake, caffeinated beverages, dairy products, fruits and vegetables): in general, an "unhealthy" diet. 1 bottle soda daily. Pt is living alone and does not cook frequently. Meals are somewhat sporadic but she is trying to eat more consistent meals vs just grabbing snacks. She tries to avoid processed/frozen meals, but snacks frequently on unhealthy snacks. Drinking about 18 beers weekly. She drinks water, and is trying to increase water intake.      Goals    . Increase physical activity    . Reduce alcohol intake      Depression Screen PHQ 2/9 Scores 02/11/2017 10/30/2016 08/19/2015  PHQ - 2 Score 0 0 0    Fall Risk Fall Risk  02/11/2017 10/30/2016 08/19/2015 02/11/2015  Falls in the past year? No No No No    Cognitive Function:       Ad8 score reviewed for issues:  Issues making decisions: No  Less interest in hobbies / activities: No  Repeats questions, stories (family complaining): No  Trouble using ordinary gadgets (microwave, computer, phone): No  Forgets the month or year: No  Mismanaging finances: No  Remembering  appts: No  Daily problems with thinking and/or memory: No Ad8 score is= 0  Screening Tests Health Maintenance  Topic Date Due  . COLONOSCOPY  02/10/2017  . PNA vac Low Risk Adult (2 of 2 - PPSV23) 10/30/2017  . TETANUS/TDAP  05/11/2023  . INFLUENZA VACCINE  Completed  . Hepatitis C Screening  Completed      Plan:    Follow-up w/ PCP as scheduled.  Call GI to schedule colonoscopy.  Bring a copy of your advance directives to your next office visit.  During the course of the visit, Elberta Fortis was educated and counseled about the following appropriate screening and preventive services:   Vaccines to include Pneumoccal, Influenza, Hepatitis B, Td, Zostavax, HCV  Cardiovascular disease screening  Colorectal cancer screening  Bone density screening  Diabetes screening  Glaucoma screening  Nutrition counseling  Patient Instructions (the written plan) were given to the patient.    Dorrene German, RN   02/11/2017   RN AWV note reviewed. Agree with documention and plan.  Penni Homans, MD

## 2017-02-11 NOTE — Assessment & Plan Note (Signed)
Still not smoking since 05/30/2016

## 2017-02-11 NOTE — Assessment & Plan Note (Signed)
hgba1c acceptable, minimize simple carbs. Increase exercise as tolerated.  

## 2017-02-28 ENCOUNTER — Ambulatory Visit (INDEPENDENT_AMBULATORY_CARE_PROVIDER_SITE_OTHER): Payer: PPO | Admitting: Family Medicine

## 2017-02-28 VITALS — BP 121/84 | HR 90

## 2017-02-28 DIAGNOSIS — I10 Essential (primary) hypertension: Secondary | ICD-10-CM | POA: Diagnosis not present

## 2017-02-28 NOTE — Progress Notes (Signed)
Pre visit review using our clinic tool,if applicable. No additional management support is needed unless otherwise documented below in the visit note.   Patient in for BP check per order from Dr. Charlett Blake dated 02/11/17.  BP = 121/84 P= 90  Per Dr. Charlett Blake patient to continue taking medications daily as ordered monitor Sodium intake and return for follow ip in 3 months. Patient has appointment scheduledcheck note reviewed.   Nurse BP check note reviewed. Agree with documention and plan.

## 2017-02-28 NOTE — Progress Notes (Signed)
Nurseblood pressure check note reviewed. Agree with documention and plan. 

## 2017-03-04 ENCOUNTER — Encounter: Payer: Self-pay | Admitting: Internal Medicine

## 2017-03-12 ENCOUNTER — Other Ambulatory Visit: Payer: Self-pay | Admitting: Family Medicine

## 2017-03-12 DIAGNOSIS — E785 Hyperlipidemia, unspecified: Secondary | ICD-10-CM

## 2017-04-16 ENCOUNTER — Ambulatory Visit (AMBULATORY_SURGERY_CENTER): Payer: Self-pay

## 2017-04-16 ENCOUNTER — Encounter: Payer: Self-pay | Admitting: Internal Medicine

## 2017-04-16 VITALS — Ht 68.0 in | Wt 198.2 lb

## 2017-04-16 DIAGNOSIS — Z86010 Personal history of colonic polyps: Secondary | ICD-10-CM

## 2017-04-16 MED ORDER — NA SULFATE-K SULFATE-MG SULF 17.5-3.13-1.6 GM/177ML PO SOLN: 1.0000 | Freq: Once | ORAL | 0 refills | Status: AC

## 2017-04-16 NOTE — Progress Notes (Signed)
Denies allergies to eggs or soy products. Denies complication of anesthesia or sedation. Denies use of weight loss medication. Denies use of O2.   Emmi instructions given for colonoscopy.  

## 2017-04-17 ENCOUNTER — Telehealth: Payer: Self-pay | Admitting: Family Medicine

## 2017-04-17 ENCOUNTER — Ambulatory Visit (HOSPITAL_BASED_OUTPATIENT_CLINIC_OR_DEPARTMENT_OTHER)
Admission: RE | Admit: 2017-04-17 | Discharge: 2017-04-17 | Disposition: A | Discharge status: Home / Self Care | Payer: PPO | Source: Ambulatory Visit | Attending: Family Medicine | Admitting: Family Medicine

## 2017-04-17 DIAGNOSIS — R600 Localized edema: Secondary | ICD-10-CM

## 2017-04-17 DIAGNOSIS — M7989 Other specified soft tissue disorders: Secondary | ICD-10-CM | POA: Diagnosis present

## 2017-04-17 NOTE — Telephone Encounter (Signed)
Patient reports 1.5 weeks of increased edema in LLE without any known injury. Due to patient risk factors will proceed with lower extremity doppler to rule out DVT

## 2017-04-23 DIAGNOSIS — F641 Gender identity disorder in adolescence and adulthood: Secondary | ICD-10-CM | POA: Diagnosis not present

## 2017-04-23 DIAGNOSIS — E875 Hyperkalemia: Secondary | ICD-10-CM | POA: Diagnosis not present

## 2017-04-30 ENCOUNTER — Ambulatory Visit (AMBULATORY_SURGERY_CENTER): Payer: PPO | Admitting: Internal Medicine

## 2017-04-30 ENCOUNTER — Encounter: Payer: Self-pay | Admitting: Internal Medicine

## 2017-04-30 VITALS — BP 135/78 | HR 72 | Temp 98.6°F | Resp 13 | Ht 68.5 in | Wt 198.0 lb

## 2017-04-30 DIAGNOSIS — Z86010 Personal history of colonic polyps: Secondary | ICD-10-CM

## 2017-04-30 DIAGNOSIS — D122 Benign neoplasm of ascending colon: Secondary | ICD-10-CM | POA: Diagnosis not present

## 2017-04-30 DIAGNOSIS — D123 Benign neoplasm of transverse colon: Secondary | ICD-10-CM

## 2017-04-30 MED ORDER — SODIUM CHLORIDE 0.9 % IV SOLN: 500.0000 mL | INTRAVENOUS | Status: DC

## 2017-04-30 NOTE — Progress Notes (Signed)
Spontaneous respirations throughout. VSS. Resting comfortably. To PACU on room air. Report to  Celia RN. 

## 2017-04-30 NOTE — Progress Notes (Signed)
No egg or soy allergy known to patient  No issues with past sedation with any surgeries  or procedures, no intubation problems  No diet pills per patient No home 02 use per patient  No blood thinners per patient  Pt denies issues with constipation  No A fib or A flutter  Pt states she was not told to stop psyllium so took this morning

## 2017-04-30 NOTE — Patient Instructions (Signed)
Discharge instructions given. Handouts on polyps,diverticulosis and hemorrhoids. Resume previous medications. YOU HAD AN ENDOSCOPIC PROCEDURE TODAY AT THE [facility]:   Refer to the procedure report that was given to you for any specific questions about what was found during the examination.  If the procedure report does not answer your questions, please call your gastroenterologist to clarify.  If you requested that your care partner not be given the details of your procedure findings, then the procedure report has been included in a sealed envelope for you to review at your convenience later.  YOU SHOULD EXPECT: Some feelings of bloating in the abdomen. Passage of more gas than usual.  Walking can help get rid of the air that was put into your GI tract during the procedure and reduce the bloating. If you had a lower endoscopy (such as a colonoscopy or flexible sigmoidoscopy) you may notice spotting of blood in your stool or on the toilet paper. If you underwent a bowel prep for your procedure, you may not have a normal bowel movement for a few days.  Please Note:  You might notice some irritation and congestion in your nose or some drainage.  This is from the oxygen used during your procedure.  There is no need for concern and it should clear up in a day or so.  SYMPTOMS TO REPORT IMMEDIATELY:   Following lower endoscopy (colonoscopy or flexible sigmoidoscopy):  Excessive amounts of blood in the stool  Significant tenderness or worsening of abdominal pains  Swelling of the abdomen that is new, acute  Fever of 100F or higher   For urgent or emergent issues, a gastroenterologist can be reached at any hour by calling [phone].   DIET:  We do recommend a small meal at first, but then you may proceed to your regular diet.  Drink plenty of fluids but you should avoid alcoholic beverages for 24 hours.  ACTIVITY:  You should plan to take it easy for the rest of today and you  should NOT DRIVE or use heavy machinery until tomorrow (because of the sedation medicines used during the test).    FOLLOW UP: Our staff will call the number listed on your records the next business day following your procedure to check on you and address any questions or concerns that you may have regarding the information given to you following your procedure. If we do not reach you, we will leave a message.  However, if you are feeling well and you are not experiencing any problems, there is no need to return our call.  We will assume that you have returned to your regular daily activities without incident.  If any biopsies were taken you will be contacted by phone or by letter within the next 1-3 weeks.  Please call us at [phone] if you have not heard about the biopsies in 3 weeks.    SIGNATURES/CONFIDENTIALITY: You and/or your care partner have signed paperwork which will be entered into your electronic medical record.  These signatures attest to the fact that that the information above on your After Visit Summary has been reviewed and is understood.  Full responsibility of the confidentiality of this discharge information lies with you and/or your care-partner. 

## 2017-04-30 NOTE — Op Note (Signed)
[facility] Patient Name: Elena Davia Procedure Date: 04/30/2017 11:08 AM MRN: [id number] Endoscopist: Jerene Bears , MD Age: 66 Referring MD:  Date of Birth: March 17, 1951 Gender: Female Account #: [id number] Procedure:                Colonoscopy Indications:              Surveillance: Personal history of adenomatous                            polyps on last colonoscopy 5 years ago Medicines:                Monitored Anesthesia Care Procedure:                Pre-Anesthesia Assessment:                           - Prior to the procedure, a History and Physical                            was performed, and patient medications and                            allergies were reviewed. The patient's tolerance of                            previous anesthesia was also reviewed. The risks                            and benefits of the procedure and the sedation                            options and risks were discussed with the patient.                            All questions were answered, and informed consent                            was obtained. Prior Anticoagulants: The patient has                            taken no previous anticoagulant or antiplatelet                            agents. ASA Grade Assessment: II - A patient with                            mild systemic disease. After reviewing the risks                            and benefits, the patient was deemed in                            satisfactory condition to undergo the procedure.  After obtaining informed consent, the colonoscope                            was passed under direct vision. Throughout the                            procedure, the patient's blood pressure, pulse, and                            oxygen saturations were monitored continuously. The                            Colonoscope was introduced through the anus and                            advanced to the the cecum,  identified by                            appendiceal orifice and ileocecal valve. The                            colonoscopy was performed without difficulty. The                            patient tolerated the procedure well. The quality                            of the bowel preparation was good (after copious                            irrigation and lavage). The ileocecal valve,                            appendiceal orifice, and rectum were photographed. Scope In: 11:21:39 AM Scope Out: 11:41:48 AM Scope Withdrawal Time: 0 hours 15 minutes 14 seconds  Total Procedure Duration: 0 hours 20 minutes 9 seconds  Findings:                 The digital rectal exam was normal.                           A 5 mm polyp was found in the ascending colon. The                            polyp was sessile. The polyp was removed with a                            cold snare. Resection and retrieval were complete.                           Two sessile polyps were found in the transverse                            colon. The polyps were  5 to 9 mm in size. These                            polyps were removed with a cold snare. Resection                            and retrieval were complete.                           Multiple small-mouthed diverticula were found in                            the sigmoid colon.                           Internal hemorrhoids were found during retroflexion                            along with a perianal skin tag. The hemorrhoids                            were small. Complications:            No immediate complications. Estimated Blood Loss:     Estimated blood loss was minimal. Impression:               - One 5 mm polyp in the ascending colon, removed                            with a cold snare. Resected and retrieved.                           - Two 5 to 9 mm polyps in the transverse colon,                            removed with a cold snare. Resected and retrieved.                            - Diverticulosis in the sigmoid colon.                           - Internal hemorrhoids. Recommendation:           - Patient has a contact number available for                            emergencies. The signs and symptoms of potential                            delayed complications were discussed with the                            patient. Return to normal activities tomorrow.                            Written discharge instructions  were provided to the                            patient.                           - Resume previous diet.                           - Continue present medications.                           - Await pathology results.                           - Repeat colonoscopy is recommended for                            surveillance. The colonoscopy date will be                            determined after pathology results from today's                            exam become available for review. Jerene Bears, MD 04/30/2017 11:47:29 AM This report has been signed electronically.

## 2017-04-30 NOTE — Progress Notes (Signed)
Called to room to assist during endoscopic procedure.  Patient ID and intended procedure confirmed with present staff. Received instructions for my participation in the procedure from the performing physician.  

## 2017-05-01 ENCOUNTER — Telehealth: Payer: Self-pay | Admitting: *Deleted

## 2017-05-01 NOTE — Telephone Encounter (Signed)
  Follow up Call-  Call back number 04/30/2017  Post procedure Call Back phone  # [phone]  Permission to leave phone message Yes  Some recent data might be hidden     Patient questions:  Do you have a fever, pain , or abdominal swelling? No. Pain Score  0 *  Have you tolerated food without any problems? Yes.    Have you been able to return to your normal activities? Yes.    Do you have any questions about your discharge instructions: Diet   No. Medications  No. Follow up visit  No.  Do you have questions or concerns about your Care? No.  Actions: * If pain score is 4 or above: No action needed, pain <4.

## 2017-05-10 ENCOUNTER — Encounter: Payer: Self-pay | Admitting: Internal Medicine

## 2017-05-20 ENCOUNTER — Ambulatory Visit (INDEPENDENT_AMBULATORY_CARE_PROVIDER_SITE_OTHER): Payer: PPO | Admitting: Family Medicine

## 2017-05-20 ENCOUNTER — Encounter: Payer: Self-pay | Admitting: Family Medicine

## 2017-05-20 VITALS — BP 134/84 | HR 73 | Temp 97.7°F | Resp 18 | Wt 199.0 lb

## 2017-05-20 DIAGNOSIS — I10 Essential (primary) hypertension: Secondary | ICD-10-CM

## 2017-05-20 DIAGNOSIS — R0600 Dyspnea, unspecified: Secondary | ICD-10-CM

## 2017-05-20 DIAGNOSIS — F640 Transsexualism: Secondary | ICD-10-CM | POA: Diagnosis not present

## 2017-05-20 DIAGNOSIS — E782 Mixed hyperlipidemia: Secondary | ICD-10-CM | POA: Diagnosis not present

## 2017-05-20 DIAGNOSIS — Z789 Other specified health status: Secondary | ICD-10-CM

## 2017-05-20 DIAGNOSIS — R739 Hyperglycemia, unspecified: Secondary | ICD-10-CM | POA: Diagnosis not present

## 2017-05-20 DIAGNOSIS — D126 Benign neoplasm of colon, unspecified: Secondary | ICD-10-CM | POA: Diagnosis not present

## 2017-05-20 HISTORY — DX: Dyspnea, unspecified: R06.00

## 2017-05-20 LAB — CBC
HCT: 41.1 % (ref 39.0–52.0)
Hemoglobin: 14.1 g/dL (ref 13.0–17.0)
MCHC: 34.3 g/dL (ref 30.0–36.0)
MCV: 95.6 fl (ref 78.0–100.0)
Platelets: 345 10*3/uL (ref 150.0–400.0)
RBC: 4.29 Mil/uL (ref 4.22–5.81)
RDW: 13.3 % (ref 11.5–15.5)
WBC: 7.9 10*3/uL (ref 4.0–10.5)

## 2017-05-20 LAB — COMPREHENSIVE METABOLIC PANEL
ALBUMIN: 4.3 g/dL (ref 3.5–5.2)
ALK PHOS: 61 U/L (ref 39–117)
ALT: 28 U/L (ref 0–53)
AST: 18 U/L (ref 0–37)
BUN: 13 mg/dL (ref 6–23)
CALCIUM: 9.8 mg/dL (ref 8.4–10.5)
CO2: 26 mEq/L (ref 19–32)
CREATININE: 0.93 mg/dL (ref 0.40–1.50)
Chloride: 101 mEq/L (ref 96–112)
GFR: 86 mL/min (ref >60.00)
Glucose, Bld: 101 mg/dL — ABNORMAL HIGH (ref 70–99)
Potassium: 4.4 mEq/L (ref 3.5–5.1)
Sodium: 134 mEq/L — ABNORMAL LOW (ref 135–145)
TOTAL PROTEIN: 6.6 g/dL (ref 6.0–8.3)
Total Bilirubin: 0.4 mg/dL (ref 0.2–1.2)

## 2017-05-20 LAB — LIPID PANEL
CHOLESTEROL: 189 mg/dL (ref 0–200)
HDL: 54.9 mg/dL (ref >39.00)
LDL Cholesterol: 104 mg/dL — ABNORMAL HIGH (ref 0–99)
NonHDL: 133.75
TRIGLYCERIDES: 150 mg/dL — AB (ref 0.0–149.0)
Total CHOL/HDL Ratio: 3
VLDL: 30 mg/dL (ref 0.0–40.0)

## 2017-05-20 LAB — HEMOGLOBIN A1C: HEMOGLOBIN A1C: 5.9 % (ref 4.6–6.5)

## 2017-05-20 LAB — TSH: TSH: 4.27 u[IU]/mL (ref 0.35–4.50)

## 2017-05-20 NOTE — Assessment & Plan Note (Signed)
hgba1c acceptable, minimize simple carbs. Increase exercise as tolerated. Continue current meds 

## 2017-05-20 NOTE — Assessment & Plan Note (Signed)
Well controlled, no changes to meds. Encouraged heart healthy diet such as the DASH diet and exercise as tolerated.  °

## 2017-05-20 NOTE — Assessment & Plan Note (Signed)
3 precancerous polyps, patient advised by Dr Hilarie Fredrickson repeat colonoscopy in 3 years, 2021

## 2017-05-20 NOTE — Patient Instructions (Signed)

## 2017-05-20 NOTE — Assessment & Plan Note (Signed)
Happens mostly with lying down at night, can occur with bending over as well. None wit exertion and no associated symptoms

## 2017-05-20 NOTE — Assessment & Plan Note (Signed)
Continues to follow with Dr Vella Raring in [place]. Testosterone is down to 3 and Estrogen is up to 130

## 2017-05-20 NOTE — Progress Notes (Signed)
Subjective:  I acted as a Education administrator for Dr. Charlett Blake. [name], [place]  Patient ID: Taylor Newman, female    DOB: [birth year], 66 y.o.   MRN: [id number]  Chief Complaint  Patient presents with  . Follow-up    HPI  Patient is in today for a 3 month follow up. She c/o shortness of breath only at night. She denies chest pain or palpitations. Patient almost one year since last cigarette. Is physically feeling well except waking up SOB but no associated symptoms. Denies CP/palp/HA/congestion/fevers/GI or GU c/o. Taking meds as prescribed. No polyuria or polydipsia. Continues to follow with endocrinology at [facility].  Patient Care Team: Mosie Lukes, MD as PCP - General (Family Medicine) Linward Natal, MD as Consulting Physician (Obstetrics and Gynecology)   Past Medical History:  Diagnosis Date  . Chest pain 01/12/2013  . Dyspnea 05/20/2017  . FHx: BRCA2 gene positive 02/20/2015   Sister with this and breast cancer.  . Grief reaction 07/15/2013  . Heart murmur   . History of chicken pox   . History of colon polyps   . History of tobacco abuse 01/13/2014   Last cigarettes 05/30/2016  . Hyperkalemia 02/11/2017  . Hyperlipidemia   . Hypertension   . Laceration of hand 10/24/2014  . Female-to-female transgender person 10/30/2016  . Female-to-female transgender person 10/30/2016   Sees endocrinology Dr Linward Natal at [facility] in [place]  . Preventative health care 08/13/2014  . Sun-damaged skin 01/13/2014  . Tobacco use disorder 01/13/2014    Past Surgical History:  Procedure Laterality Date  . COLONOSCOPY    . POLYPECTOMY    . TONSILLECTOMY  1954    Family History  Problem Relation Age of Onset  . Colon cancer Father 18  . Hyperlipidemia Father   . Heart disease Father        stent placement  . Hypertension Father   . Cancer Father 43       colon with liver mets  . Diabetes Father   . Breast cancer Sister        BRCA2 positive  . Cancer Sister        twice, breast  cancer, BRCA 2 gene mutation  . Hyperlipidemia Mother   . Alzheimer's disease Mother   . Hypertension Mother   . Diabetes Maternal Grandmother        father  . Cancer Son 18  . Colon cancer Son        Joslyn Hy - not biologically related  . Mental illness Son        bipolar  . Allergies Sister   . Breast cancer Paternal Aunt        2 paternal aunts with breast cancer postmenopausal  . Breast cancer Cousin        3 pat female first cousins with breast cancer  . Breast cancer Other        niece with breast cancer  . Prostate cancer Neg Hx   . Esophageal cancer Neg Hx   . Rectal cancer Neg Hx   . Stomach cancer Neg Hx     Social History   Social History  . Marital status: Married    Spouse name: N/A  . Number of children: N/A  . Years of education: N/A   Occupational History  . Not on file.   Social History Main Topics  . Smoking status: Former Smoker    Packs/day: 1.00    Years: 45.00    Types:  Cigarettes    Quit date: 05/14/2016  . Smokeless tobacco: Never Used     Comment: 1/2  ppd  . Alcohol use 10.8 oz/week    18 Cans of beer per week  . Drug use: No  . Sexual activity: Not on file   Other Topics Concern  . Not on file   Social History Narrative  . No narrative on file    Outpatient Medications Prior to Visit  Medication Sig Dispense Refill  . amLODipine (NORVASC) 5 MG tablet Take 1 tablet (5 mg total) by mouth daily. 90 tablet 3  . aspirin 81 MG chewable tablet Chew 81 mg by mouth daily.    Marland Kitchen atorvastatin (LIPITOR) 20 MG tablet TAKE 1 TABLET (20 MG) BY MOUTH DAILY. 90 tablet 1  . Biotin 5000 MCG TABS Take 1 tablet by mouth daily.    Marland Kitchen estradiol (ESTRACE) 2 MG tablet Take 1 tablet by mouth daily.    Marland Kitchen KRILL OIL PO Take 1 capsule by mouth daily.    Marland Kitchen lactobacillus acidophilus (BACID) TABS tablet Take 1 tablet by mouth daily.    Marland Kitchen MELATONIN PO Take by mouth at bedtime as needed.    . Multiple Vitamin (MULTIVITAMIN) tablet Take 1 tablet by mouth daily.      . psyllium (REGULOID) 0.52 G capsule Take 0.52 g by mouth daily.    Marland Kitchen spironolactone (ALDACTONE) 100 MG tablet Take 50 mg by mouth once.      Facility-Administered Medications Prior to Visit  Medication Dose Route Frequency Provider Last Rate Last Dose  . 0.9 %  sodium chloride infusion  500 mL Intravenous Continuous Pyrtle, Lajuan Lines, MD        Allergies  Allergen Reactions  . Lisinopril Cough    hoarseness    Review of Systems  Constitutional: Negative for fever and malaise/fatigue.  HENT: Negative for congestion.   Eyes: Negative for blurred vision.  Respiratory: Positive for shortness of breath. Negative for cough.   Cardiovascular: Negative for chest pain, palpitations and leg swelling.  Gastrointestinal: Negative for vomiting.  Musculoskeletal: Negative for back pain.  Skin: Negative for rash.  Neurological: Negative for loss of consciousness and headaches.       Objective:    Physical Exam  Constitutional: He is oriented to person, place, and time. He appears well-developed and well-nourished. No distress.  HENT:  Head: Normocephalic and atraumatic.  Eyes: Conjunctivae are normal.  Neck: Normal range of motion. No thyromegaly present.  Cardiovascular: Normal rate and regular rhythm.   Pulmonary/Chest: Effort normal and breath sounds normal. He has no wheezes.  Abdominal: Soft. Bowel sounds are normal. There is no tenderness.  Musculoskeletal: Normal range of motion. He exhibits no edema or deformity.  Neurological: He is alert and oriented to person, place, and time.  Skin: Skin is warm and dry. He is not diaphoretic.  Psychiatric: He has a normal mood and affect.    BP 134/84 (BP Location: Left Arm, Patient Position: Sitting, Cuff Size: Normal)   Pulse 73   Temp 97.7 F (36.5 C) (Oral)   Resp 18   Wt 199 lb (90.3 kg)   SpO2 98%   BMI 29.82 kg/m  Wt Readings from Last 3 Encounters:  05/20/17 199 lb (90.3 kg)  04/30/17 198 lb (89.8 kg)  04/16/17 198 lb 3.2  oz (89.9 kg)   BP Readings from Last 3 Encounters:  05/20/17 134/84  04/30/17 135/78  02/28/17 121/84     Immunization History  Administered  Date(s) Administered  . Influenza-Unspecified 10/08/2013, 09/20/2014, 09/10/2015, 09/10/2016  . Pneumococcal Conjugate-13 02/11/2015, 10/30/2016  . Td 05/10/2013  . Tdap 11/03/2007  . Zoster 09/25/2010    Health Maintenance  Topic Date Due  . INFLUENZA VACCINE  07/10/2017  . PNA vac Low Risk Adult (2 of 2 - PPSV23) 10/30/2017  . COLONOSCOPY  04/30/2020  . TETANUS/TDAP  05/11/2023  . Hepatitis C Screening  Completed    Lab Results  Component Value Date   WBC 7.9 05/20/2017   HGB 14.1 05/20/2017   HCT 41.1 05/20/2017   PLT 345.0 05/20/2017   GLUCOSE 101 (H) 05/20/2017   CHOL 189 05/20/2017   TRIG 150.0 (H) 05/20/2017   HDL 54.90 05/20/2017   LDLCALC 104 (H) 05/20/2017   ALT 28 05/20/2017   AST 18 05/20/2017   NA 134 (L) 05/20/2017   K 4.4 05/20/2017   CL 101 05/20/2017   CREATININE 0.93 05/20/2017   BUN 13 05/20/2017   CO2 26 05/20/2017   TSH 4.27 05/20/2017   PSA 0.39 10/30/2016   HGBA1C 5.9 05/20/2017   MICROALBUR <0.7 08/19/2015    Lab Results  Component Value Date   TSH 4.27 05/20/2017   Lab Results  Component Value Date   WBC 7.9 05/20/2017   HGB 14.1 05/20/2017   HCT 41.1 05/20/2017   MCV 95.6 05/20/2017   PLT 345.0 05/20/2017   Lab Results  Component Value Date   NA 134 (L) 05/20/2017   K 4.4 05/20/2017   CO2 26 05/20/2017   GLUCOSE 101 (H) 05/20/2017   BUN 13 05/20/2017   CREATININE 0.93 05/20/2017   BILITOT 0.4 05/20/2017   ALKPHOS 61 05/20/2017   AST 18 05/20/2017   ALT 28 05/20/2017   PROT 6.6 05/20/2017   ALBUMIN 4.3 05/20/2017   CALCIUM 9.8 05/20/2017   GFR 86.00 05/20/2017   Lab Results  Component Value Date   CHOL 189 05/20/2017   Lab Results  Component Value Date   HDL 54.90 05/20/2017   Lab Results  Component Value Date   LDLCALC 104 (H) 05/20/2017   Lab Results  Component  Value Date   TRIG 150.0 (H) 05/20/2017   Lab Results  Component Value Date   CHOLHDL 3 05/20/2017   Lab Results  Component Value Date   HGBA1C 5.9 05/20/2017         Assessment & Plan:   Problem List Items Addressed This Visit    Colon polyps    3 precancerous polyps, patient advised by Dr Hilarie Fredrickson repeat colonoscopy in 3 years, 2021      Hyperlipidemia    Tolerating statin, encouraged heart healthy diet, avoid trans fats, minimize simple carbs and saturated fats. Increase exercise as tolerated      Relevant Orders   Lipid panel (Completed)   HTN (hypertension) - Primary    Well controlled, no changes to meds. Encouraged heart healthy diet such as the DASH diet and exercise as tolerated.       Relevant Orders   Comprehensive metabolic panel (Completed)   CBC (Completed)   TSH (Completed)   Hyperglycemia    hgba1c acceptable, minimize simple carbs. Increase exercise as tolerated. Continue current meds      Relevant Orders   Hemoglobin A1c (Completed)   Female-to-female transgender person    Continues to follow with Dr Vella Raring in [place]. Testosterone is down to 3 and Estrogen is up to 130      Dyspnea    Happens mostly with lying down  at night, can occur with bending over as well. None wit exertion and no associated symptoms      Relevant Orders   ECHOCARDIOGRAM COMPLETE      I am having Mr. Westergard maintain his aspirin, KRILL OIL PO, lactobacillus acidophilus, multivitamin, psyllium, spironolactone, estradiol, amLODipine, MELATONIN PO, Biotin, and atorvastatin. We will continue to administer sodium chloride.  No orders of the defined types were placed in this encounter.   CMA served as Education administrator during this visit. History, Physical and Plan performed by medical provider. Documentation and orders reviewed and attested to.  Penni Homans, MD

## 2017-05-20 NOTE — Assessment & Plan Note (Signed)
Tolerating statin, encouraged heart healthy diet, avoid trans fats, minimize simple carbs and saturated fats. Increase exercise as tolerated 

## 2017-05-29 ENCOUNTER — Other Ambulatory Visit (HOSPITAL_BASED_OUTPATIENT_CLINIC_OR_DEPARTMENT_OTHER): Payer: Self-pay

## 2017-06-05 ENCOUNTER — Ambulatory Visit (HOSPITAL_BASED_OUTPATIENT_CLINIC_OR_DEPARTMENT_OTHER)
Admission: RE | Admit: 2017-06-05 | Discharge: 2017-06-05 | Disposition: A | Discharge status: Home / Self Care | Payer: PPO | Source: Ambulatory Visit | Attending: Family Medicine | Admitting: Family Medicine

## 2017-06-05 DIAGNOSIS — R0600 Dyspnea, unspecified: Secondary | ICD-10-CM | POA: Diagnosis not present

## 2017-06-05 DIAGNOSIS — I517 Cardiomegaly: Secondary | ICD-10-CM | POA: Insufficient documentation

## 2017-06-05 NOTE — Progress Notes (Signed)
  Echocardiogram 2D Echocardiogram has been performed.  Jennette Dubin 06/05/2017, 9:12 AM

## 2017-07-06 ENCOUNTER — Emergency Department (INDEPENDENT_AMBULATORY_CARE_PROVIDER_SITE_OTHER)
Admission: EM | Admit: 2017-07-06 | Discharge: 2017-07-06 | Disposition: A | Discharge status: Home / Self Care | Payer: PPO | Source: Home / Self Care | Attending: Family Medicine | Admitting: Family Medicine

## 2017-07-06 ENCOUNTER — Encounter: Payer: Self-pay | Admitting: Emergency Medicine

## 2017-07-06 DIAGNOSIS — L03011 Cellulitis of right finger: Secondary | ICD-10-CM | POA: Diagnosis not present

## 2017-07-06 MED ORDER — MUPIROCIN 2 % EX OINT: TOPICAL_OINTMENT | CUTANEOUS | 0 refills | Status: DC

## 2017-07-06 MED ORDER — DOXYCYCLINE HYCLATE 100 MG PO CAPS: 100.0000 mg | ORAL_CAPSULE | Freq: Two times a day (BID) | ORAL | 0 refills | Status: DC

## 2017-07-06 NOTE — ED Provider Notes (Signed)
CSN: [id number]     Arrival date & time 07/06/17  0920 History   First MD Initiated Contact with Patient 07/06/17 [phone]     Chief Complaint  Patient presents with  . Hand Pain   (Consider location/radiation/quality/duration/timing/severity/associated sxs/prior Treatment) HPI  Taylor Newman is a 66 y.o. female presenting to UC with c/o sudden onset Right thumb pain with redness and yellow-green discoloration under nailbed.  Pt had nails done about 3 weeks ago but has not had symptoms until yesterday. No hx of similar symptoms.  Pain is 2/10 at this time.  Denies fever, chills, n/v/d.  Hx of MRSA.   Past Medical History:  Diagnosis Date  . Chest pain 01/12/2013  . Dyspnea 05/20/2017  . FHx: BRCA2 gene positive 02/20/2015   Sister with this and breast cancer.  . Grief reaction 07/15/2013  . Heart murmur   . History of chicken pox   . History of colon polyps   . History of tobacco abuse 01/13/2014   Last cigarettes 05/30/2016  . Hyperkalemia 02/11/2017  . Hyperlipidemia   . Hypertension   . Laceration of hand 10/24/2014  . Female-to-female transgender person 10/30/2016  . Female-to-female transgender person 10/30/2016   Sees endocrinology Dr Linward Natal at [facility] in [place]  . Preventative health care 08/13/2014  . Sun-damaged skin 01/13/2014  . Tobacco use disorder 01/13/2014   Past Surgical History:  Procedure Laterality Date  . COLONOSCOPY    . POLYPECTOMY    . TONSILLECTOMY  1954   Family History  Problem Relation Age of Onset  . Colon cancer Father 29  . Hyperlipidemia Father   . Heart disease Father        stent placement  . Hypertension Father   . Cancer Father 94       colon with liver mets  . Diabetes Father   . Breast cancer Sister        BRCA2 positive  . Cancer Sister        twice, breast cancer, BRCA 2 gene mutation  . Hyperlipidemia Mother   . Alzheimer's disease Mother   . Hypertension Mother   . Diabetes Maternal Grandmother        father   . Cancer Son 27  . Colon cancer Son        Joslyn Hy - not biologically related  . Mental illness Son        bipolar  . Allergies Sister   . Breast cancer Paternal Aunt        2 paternal aunts with breast cancer postmenopausal  . Breast cancer Cousin        3 pat female first cousins with breast cancer  . Breast cancer Other        niece with breast cancer  . Prostate cancer Neg Hx   . Esophageal cancer Neg Hx   . Rectal cancer Neg Hx   . Stomach cancer Neg Hx    Social History  Substance Use Topics  . Smoking status: Former Smoker    Packs/day: 1.00    Years: 45.00    Types: Cigarettes    Quit date: 05/14/2016  . Smokeless tobacco: Never Used     Comment: 1/2  ppd  . Alcohol use 10.8 oz/week    18 Cans of beer per week    Review of Systems  Constitutional: Negative for chills and fever.  Musculoskeletal: Negative for arthralgias, joint swelling and myalgias.  Skin: Positive for color change. Negative  for wound.    Allergies  Lisinopril  Home Medications   Prior to Admission medications   Medication Sig Start Date End Date Taking? Authorizing Provider  amLODipine (NORVASC) 5 MG tablet Take 1 tablet (5 mg total) by mouth daily. 10/30/16   Mosie Lukes, MD  aspirin 81 MG chewable tablet Chew 81 mg by mouth daily.    [provider]  atorvastatin (LIPITOR) 20 MG tablet TAKE 1 TABLET (20 MG) BY MOUTH DAILY. 03/12/17   Mosie Lukes, MD  Biotin 5000 MCG TABS Take 1 tablet by mouth daily.    [provider]  doxycycline (VIBRAMYCIN) 100 MG capsule Take 1 capsule (100 mg total) by mouth 2 (two) times daily. One po bid x 7 days 07/06/17   Noe Gens, PA-C  estradiol (ESTRACE) 2 MG tablet Take 1 tablet by mouth daily. 10/08/16   [provider]  KRILL OIL PO Take 1 capsule by mouth daily.    [provider]  lactobacillus acidophilus (BACID) TABS tablet Take 1 tablet by mouth daily.    [provider]  MELATONIN PO Take by  mouth at bedtime as needed.    [provider]  Multiple Vitamin (MULTIVITAMIN) tablet Take 1 tablet by mouth daily.    [provider]  mupirocin ointment (BACTROBAN) 2 % Apply to thumb 3 times daily for 5 days 07/06/17   Noe Gens, PA-C  psyllium (REGULOID) 0.52 G capsule Take 0.52 g by mouth daily.    [provider]  spironolactone (ALDACTONE) 100 MG tablet Take 50 mg by mouth once.  10/08/16   [provider]   Meds Ordered and Administered this Visit  Medications - No data to display  BP (!) 153/95 (BP Location: Left Arm)   Pulse 70   Temp 98 F (36.7 C) (Oral)   Resp 16   Ht '5\' 8"'$  (1.727 m)   Wt 201 lb (91.2 kg)   SpO2 96%   BMI 30.56 kg/m  No data found.   Physical Exam  Constitutional: He is oriented to person, place, and time. He appears well-developed and well-nourished. No distress.  HENT:  Head: Normocephalic and atraumatic.  Eyes: EOM are normal.  Neck: Normal range of motion.  Cardiovascular: Normal rate.   Pulmonary/Chest: Effort normal.  Musculoskeletal: Normal range of motion. He exhibits tenderness. He exhibits no edema.  Right thumb: minimal edema, tender around nail bed. Full ROM.  Neurological: He is alert and oriented to person, place, and time.  Skin: Skin is warm and dry. Capillary refill takes less than 2 seconds. He is not diaphoretic. There is erythema.  Right thumb: small paronychia at base of nail. No active bleeding or drainage. Minimal fluctuance.   Psychiatric: He has a normal mood and affect. His behavior is normal.  Nursing note and vitals reviewed.   Urgent Care Course     Procedures (including critical care time)  Labs Review Labs Reviewed - No data to display  Imaging Review No results found.  Right thumb: cleaned with alcohol swab. Freeze spray applied. Straight incision maid through epidermis. Scant red blood. No pus.  Bacitracin and bandage applied w/o immediate complication.   MDM    1. Acute paronychia of right thumb    Paronychia of Right thumb. I&D performed w/o much success.  Rx: mupirocin ointment, doxycycline  F/u with PCP or return to UC in 4-5 days if not improving.  Home care instructions provided.     Gerarda Fraction,  Bronwen Betters, PA-C 07/06/17 1058

## 2017-07-06 NOTE — ED Triage Notes (Signed)
Patient presents to [facility] with C/O pain in the right thumb, base of the nail. Denies injury, redness, pain and edema noted.

## 2017-09-17 ENCOUNTER — Other Ambulatory Visit: Payer: Self-pay | Admitting: Family Medicine

## 2017-09-17 DIAGNOSIS — E785 Hyperlipidemia, unspecified: Secondary | ICD-10-CM

## 2017-09-19 ENCOUNTER — Other Ambulatory Visit: Payer: Self-pay

## 2017-09-19 DIAGNOSIS — E785 Hyperlipidemia, unspecified: Secondary | ICD-10-CM

## 2017-09-19 MED ORDER — ATORVASTATIN CALCIUM 20 MG PO TABS: ORAL_TABLET | ORAL | 1 refills | Status: DC

## 2017-09-24 ENCOUNTER — Encounter: Payer: Self-pay | Admitting: Family Medicine

## 2017-09-24 ENCOUNTER — Ambulatory Visit (INDEPENDENT_AMBULATORY_CARE_PROVIDER_SITE_OTHER): Payer: PPO | Admitting: Family Medicine

## 2017-09-24 VITALS — BP 130/90 | HR 65 | Temp 98.2°F | Resp 18 | Ht 68.0 in | Wt 199.0 lb

## 2017-09-24 DIAGNOSIS — F640 Transsexualism: Secondary | ICD-10-CM

## 2017-09-24 DIAGNOSIS — Z789 Other specified health status: Secondary | ICD-10-CM

## 2017-09-24 DIAGNOSIS — Z0000 Encounter for general adult medical examination without abnormal findings: Secondary | ICD-10-CM | POA: Diagnosis not present

## 2017-09-24 DIAGNOSIS — I10 Essential (primary) hypertension: Secondary | ICD-10-CM | POA: Diagnosis not present

## 2017-09-24 DIAGNOSIS — R739 Hyperglycemia, unspecified: Secondary | ICD-10-CM | POA: Diagnosis not present

## 2017-09-24 DIAGNOSIS — Z87891 Personal history of nicotine dependence: Secondary | ICD-10-CM

## 2017-09-24 DIAGNOSIS — E782 Mixed hyperlipidemia: Secondary | ICD-10-CM | POA: Diagnosis not present

## 2017-09-24 LAB — COMPREHENSIVE METABOLIC PANEL
ALK PHOS: 58 U/L (ref 39–117)
ALT: 35 U/L (ref 0–53)
AST: 26 U/L (ref 0–37)
Albumin: 4.2 g/dL (ref 3.5–5.2)
BILIRUBIN TOTAL: 0.5 mg/dL (ref 0.2–1.2)
BUN: 10 mg/dL (ref 6–23)
CALCIUM: 10.1 mg/dL (ref 8.4–10.5)
CO2: 25 meq/L (ref 19–32)
CREATININE: 0.95 mg/dL (ref 0.40–1.50)
Chloride: 100 mEq/L (ref 96–112)
GFR: 83.83 mL/min (ref >60.00)
GLUCOSE: 101 mg/dL — AB (ref 70–99)
Potassium: 4.6 mEq/L (ref 3.5–5.1)
Sodium: 134 mEq/L — ABNORMAL LOW (ref 135–145)
TOTAL PROTEIN: 6.9 g/dL (ref 6.0–8.3)

## 2017-09-24 LAB — CBC
HCT: 42.6 % (ref 39.0–52.0)
HEMOGLOBIN: 14.5 g/dL (ref 13.0–17.0)
MCHC: 33.9 g/dL (ref 30.0–36.0)
MCV: 97.4 fl (ref 78.0–100.0)
PLATELETS: 305 10*3/uL (ref 150.0–400.0)
RBC: 4.38 Mil/uL (ref 4.22–5.81)
RDW: 13 % (ref 11.5–15.5)
WBC: 9.1 10*3/uL (ref 4.0–10.5)

## 2017-09-24 LAB — LIPID PANEL
CHOL/HDL RATIO: 3
Cholesterol: 173 mg/dL (ref 0–200)
HDL: 57.7 mg/dL (ref >39.00)
LDL Cholesterol: 81 mg/dL (ref 0–99)
NONHDL: 114.94
TRIGLYCERIDES: 169 mg/dL — AB (ref 0.0–149.0)
VLDL: 33.8 mg/dL (ref 0.0–40.0)

## 2017-09-24 LAB — HEMOGLOBIN A1C: HEMOGLOBIN A1C: 5.9 % (ref 4.6–6.5)

## 2017-09-24 LAB — TSH: TSH: 3.81 u[IU]/mL (ref 0.35–4.50)

## 2017-09-24 MED ORDER — AMLODIPINE BESYLATE 5 MG PO TABS: 5.0000 mg | ORAL_TABLET | Freq: Two times a day (BID) | ORAL | 1 refills | Status: DC

## 2017-09-24 MED ORDER — SPIRONOLACTONE 50 MG PO TABS: 50.0000 mg | ORAL_TABLET | Freq: Every day | ORAL | 1 refills | Status: DC

## 2017-09-24 NOTE — Assessment & Plan Note (Signed)
Tolerating statin, encouraged heart healthy diet, avoid trans fats, minimize simple carbs and saturated fats. Increase exercise as tolerated 

## 2017-09-24 NOTE — Assessment & Plan Note (Signed)
Still not smoking since 2017

## 2017-09-24 NOTE — Patient Instructions (Addendum)
Shingrix is the new shingles shot  Preventive Care 65 Years and Older, Female Preventive care refers to lifestyle choices and visits with your health care provider that can promote health and wellness. What does preventive care include?  A yearly physical exam. This is also called an annual well check.  Dental exams once or twice a year.  Routine eye exams. Ask your health care provider how often you should have your eyes checked.  Personal lifestyle choices, including: ? Daily care of your teeth and gums. ? Regular physical activity. ? Eating a healthy diet. ? Avoiding tobacco and drug use. ? Limiting alcohol use. ? Practicing safe sex. ? Taking low-dose aspirin every day. ? Taking vitamin and mineral supplements as recommended by your health care provider. What happens during an annual well check? The services and screenings done by your health care provider during your annual well check will depend on your age, overall health, lifestyle risk factors, and family history of disease. Counseling Your health care provider may ask you questions about your:  Alcohol use.  Tobacco use.  Drug use.  Emotional well-being.  Home and relationship well-being.  Sexual activity.  Eating habits.  History of falls.  Memory and ability to understand (cognition).  Work and work Astronomer.  Reproductive health.  Screening You may have the following tests or measurements:  Height, weight, and BMI.  Blood pressure.  Lipid and cholesterol levels. These may be checked every 5 years, or more frequently if you are over 39 years old.  Skin check.  Lung cancer screening. You may have this screening every year starting at age 82 if you have a 30-pack-year history of smoking and currently smoke or have quit within the past 15 years.  Fecal occult blood test (FOBT) of the stool. You may have this test every year starting at age 58.  Flexible sigmoidoscopy or colonoscopy. You may  have a sigmoidoscopy every 5 years or a colonoscopy every 10 years starting at age 47.  Hepatitis C blood test.  Hepatitis B blood test.  Sexually transmitted disease (STD) testing.  Diabetes screening. This is done by checking your blood sugar (glucose) after you have not eaten for a while (fasting). You may have this done every 1-3 years.  Bone density scan. This is done to screen for osteoporosis. You may have this done starting at age 77.  Mammogram. This may be done every 1-2 years. Talk to your health care provider about how often you should have regular mammograms.  Talk with your health care provider about your test results, treatment options, and if necessary, the need for more tests. Vaccines Your health care provider may recommend certain vaccines, such as:  Influenza vaccine. This is recommended every year.  Tetanus, diphtheria, and acellular pertussis (Tdap, Td) vaccine. You may need a Td booster every 10 years.  Varicella vaccine. You may need this if you have not been vaccinated.  Zoster vaccine. You may need this after age 30.  Measles, mumps, and rubella (MMR) vaccine. You may need at least one dose of MMR if you were born in 1957 or later. You may also need a second dose.  Pneumococcal 13-valent conjugate (PCV13) vaccine. One dose is recommended after age 52.  Pneumococcal polysaccharide (PPSV23) vaccine. One dose is recommended after age 34.  Meningococcal vaccine. You may need this if you have certain conditions.  Hepatitis A vaccine. You may need this if you have certain conditions or if you travel or work in places  where you may be exposed to hepatitis A.  Hepatitis B vaccine. You may need this if you have certain conditions or if you travel or work in places where you may be exposed to hepatitis B.  Haemophilus influenzae type b (Hib) vaccine. You may need this if you have certain conditions.  Talk to your health care provider about which screenings and  vaccines you need and how often you need them. This information is not intended to replace advice given to you by your health care provider. Make sure you discuss any questions you have with your health care provider. Document Released: 12/23/2015 Document Revised: 08/15/2016 Document Reviewed: 09/27/2015 Elsevier Interactive Patient Education  2017 [employer].

## 2017-09-24 NOTE — Assessment & Plan Note (Addendum)
Doing well on hormones, following with endocrinology. Has chosen not to undergo vaginoplasty but is considering orchioplasty so she can change her birth certificate identifier. Has not decided yet.

## 2017-09-24 NOTE — Assessment & Plan Note (Signed)
Patient encouraged to maintain heart healthy diet, regular exercise, adequate sleep. Consider daily probiotics. Take medications as prescribed. Check labs today.  

## 2017-09-24 NOTE — Progress Notes (Signed)
Subjective:  I acted as a Education administrator for Dr. Charlett Blake. [name], [place]  Patient ID: Taylor Newman, female    DOB: 1951-06-13, 66 y.o.   MRN: [id number]  No chief complaint on file.   HPI  Patient is in today for an annual exam. She is following up on her HTn, hyperlipidemia, hyperglycemia and other medical concerns. She presently has no acute concerns. She states she is still not smoking and she feels great. No recent febrile illness or acute hospitalizations. Denies CP/palp/SOB/HA/congestion/fevers/GI or GU c/o. Taking meds as prescribed. Feels well. Continues to follow with endocrinology. Is trying to maintain a heart healthy diet and has been more active. Has lost a few pounds since last visit. No polyuria or polydipsia.   Patient Care Team: Mosie Lukes, MD as PCP - General (Family Medicine) Linward Natal, MD as Consulting Physician (Obstetrics and Gynecology)   Past Medical History:  Diagnosis Date  . Chest pain 01/12/2013  . Dyspnea 05/20/2017  . FHx: BRCA2 gene positive 02/20/2015   Sister with this and breast cancer.  . Grief reaction 07/15/2013  . Heart murmur   . History of chicken pox   . History of colon polyps   . History of tobacco abuse 01/13/2014   Last cigarettes 05/30/2016  . Hyperkalemia 02/11/2017  . Hyperlipidemia   . Hypertension   . Laceration of hand 10/24/2014  . Female-to-female transgender person 10/30/2016  . Female-to-female transgender person 10/30/2016   Sees endocrinology Dr Linward Natal at [facility] in [place]  . Preventative health care 08/13/2014  . Sun-damaged skin 01/13/2014  . Tobacco use disorder 01/13/2014    Past Surgical History:  Procedure Laterality Date  . COLONOSCOPY    . POLYPECTOMY    . TONSILLECTOMY  1954    Family History  Problem Relation Age of Onset  . Colon cancer Father 76  . Hyperlipidemia Father   . Heart disease Father        stent placement  . Hypertension Father   . Cancer Father 18       colon with  liver mets  . Diabetes Father   . Breast cancer Sister        BRCA2 positive  . Cancer Sister        twice, breast cancer, BRCA 2 gene mutation  . Hyperlipidemia Mother   . Alzheimer's disease Mother   . Hypertension Mother   . Diabetes Maternal Grandmother        father  . Cancer Son 59  . Colon cancer Son        Joslyn Hy - not biologically related  . Mental illness Son        bipolar  . Allergies Sister   . Breast cancer Paternal Aunt        2 paternal aunts with breast cancer postmenopausal  . Breast cancer Cousin        3 pat female first cousins with breast cancer  . Breast cancer Other        niece with breast cancer  . Prostate cancer Neg Hx   . Esophageal cancer Neg Hx   . Rectal cancer Neg Hx   . Stomach cancer Neg Hx     Social History   Social History  . Marital status: Married    Spouse name: N/A  . Number of children: N/A  . Years of education: N/A   Occupational History  . Not on file.   Social History Main Topics  .  Smoking status: Former Smoker    Packs/day: 1.00    Years: 45.00    Types: Cigarettes    Quit date: 05/14/2016  . Smokeless tobacco: Never Used     Comment: 1/2  ppd  . Alcohol use 10.8 oz/week    18 Cans of beer per week  . Drug use: No  . Sexual activity: Not on file   Other Topics Concern  . Not on file   Social History Narrative  . No narrative on file    Outpatient Medications Prior to Visit  Medication Sig Dispense Refill  . aspirin 81 MG chewable tablet Chew 81 mg by mouth daily.    Marland Kitchen atorvastatin (LIPITOR) 20 MG tablet TAKE 1 TABLET (20 MG) BY MOUTH DAILY. 90 tablet 1  . Biotin 5000 MCG TABS Take 1 tablet by mouth daily.    Marland Kitchen estradiol (ESTRACE) 2 MG tablet Take 1 tablet by mouth daily.    Marland Kitchen KRILL OIL PO Take 1 capsule by mouth daily.    Marland Kitchen lactobacillus acidophilus (BACID) TABS tablet Take 1 tablet by mouth daily.    Marland Kitchen MELATONIN PO Take by mouth at bedtime as needed.    . Multiple Vitamin (MULTIVITAMIN) tablet Take 1  tablet by mouth daily.    . psyllium (REGULOID) 0.52 G capsule Take 0.52 g by mouth daily.    Marland Kitchen amLODipine (NORVASC) 5 MG tablet Take 1 tablet (5 mg total) by mouth daily. 90 tablet 3  . spironolactone (ALDACTONE) 100 MG tablet Take 50 mg by mouth once.     . doxycycline (VIBRAMYCIN) 100 MG capsule Take 1 capsule (100 mg total) by mouth 2 (two) times daily. One po bid x 7 days 14 capsule 0  . mupirocin ointment (BACTROBAN) 2 % Apply to thumb 3 times daily for 5 days 22 g 0  . 0.9 %  sodium chloride infusion      No facility-administered medications prior to visit.     Allergies  Allergen Reactions  . Lisinopril Cough    hoarseness    Review of Systems  Constitutional: Negative for fever and malaise/fatigue.  HENT: Negative for congestion.   Eyes: Negative for blurred vision.  Respiratory: Negative for cough and shortness of breath.   Cardiovascular: Negative for chest pain, palpitations and leg swelling.  Gastrointestinal: Negative for vomiting.  Musculoskeletal: Negative for back pain.  Skin: Negative for rash.  Neurological: Negative for loss of consciousness and headaches.       Objective:    Physical Exam  Constitutional: He is oriented to person, place, and time. He appears well-developed and well-nourished. No distress.  HENT:  Head: Normocephalic and atraumatic.  Eyes: Conjunctivae are normal.  Neck: Normal range of motion. No thyromegaly present.  Cardiovascular: Normal rate and regular rhythm.   Pulmonary/Chest: Effort normal and breath sounds normal. He has no wheezes.  Abdominal: Soft. Bowel sounds are normal. There is no tenderness.  Musculoskeletal: Normal range of motion. He exhibits no edema or deformity.  Neurological: He is alert and oriented to person, place, and time.  Skin: Skin is warm and dry. He is not diaphoretic.  Psychiatric: He has a normal mood and affect.    BP 130/90 (BP Location: Left Arm, Patient Position: Sitting, Cuff Size: Normal)    Pulse 65   Temp 98.2 F (36.8 C) (Oral)   Resp 18   Ht _0  (1.727 m)   Wt 199 lb (90.3 kg)   SpO2 98%   BMI 30.26 kg/m  Wt Readings from Last 3 Encounters:  09/24/17 199 lb (90.3 kg)  07/06/17 201 lb (91.2 kg)  05/20/17 199 lb (90.3 kg)   BP Readings from Last 3 Encounters:  09/24/17 130/90  07/06/17 (!) 153/95  05/20/17 134/84     Immunization History  Administered Date(s) Administered  . Influenza-Unspecified 10/08/2013, 09/20/2014, 09/10/2015, 09/10/2016  . Pneumococcal Conjugate-13 02/11/2015, 10/30/2016  . Td 05/10/2013  . Tdap 11/03/2007  . Zoster 09/25/2010    Health Maintenance  Topic Date Due  . PNA vac Low Risk Adult (2 of 2 - PPSV23) 10/30/2017  . COLONOSCOPY  04/30/2020  . TETANUS/TDAP  05/11/2023  . INFLUENZA VACCINE  Completed  . Hepatitis C Screening  Completed    Lab Results  Component Value Date   WBC 7.9 05/20/2017   HGB 14.1 05/20/2017   HCT 41.1 05/20/2017   PLT 345.0 05/20/2017   GLUCOSE 101 (H) 05/20/2017   CHOL 189 05/20/2017   TRIG 150.0 (H) 05/20/2017   HDL 54.90 05/20/2017   LDLCALC 104 (H) 05/20/2017   ALT 28 05/20/2017   AST 18 05/20/2017   NA 134 (L) 05/20/2017   K 4.4 05/20/2017   CL 101 05/20/2017   CREATININE 0.93 05/20/2017   BUN 13 05/20/2017   CO2 26 05/20/2017   TSH 4.27 05/20/2017   PSA 0.39 10/30/2016   HGBA1C 5.9 05/20/2017   MICROALBUR <0.7 08/19/2015    Lab Results  Component Value Date   TSH 4.27 05/20/2017   Lab Results  Component Value Date   WBC 7.9 05/20/2017   HGB 14.1 05/20/2017   HCT 41.1 05/20/2017   MCV 95.6 05/20/2017   PLT 345.0 05/20/2017   Lab Results  Component Value Date   NA 134 (L) 05/20/2017   K 4.4 05/20/2017   CO2 26 05/20/2017   GLUCOSE 101 (H) 05/20/2017   BUN 13 05/20/2017   CREATININE 0.93 05/20/2017   BILITOT 0.4 05/20/2017   ALKPHOS 61 05/20/2017   AST 18 05/20/2017   ALT 28 05/20/2017   PROT 6.6 05/20/2017   ALBUMIN 4.3 05/20/2017   CALCIUM 9.8 05/20/2017    GFR 86.00 05/20/2017   Lab Results  Component Value Date   CHOL 189 05/20/2017   Lab Results  Component Value Date   HDL 54.90 05/20/2017   Lab Results  Component Value Date   LDLCALC 104 (H) 05/20/2017   Lab Results  Component Value Date   TRIG 150.0 (H) 05/20/2017   Lab Results  Component Value Date   CHOLHDL 3 05/20/2017   Lab Results  Component Value Date   HGBA1C 5.9 05/20/2017         Assessment & Plan:   Problem List Items Addressed This Visit    Hyperlipidemia    Tolerating statin, encouraged heart healthy diet, avoid trans fats, minimize simple carbs and saturated fats. Increase exercise as tolerated      Relevant Medications   spironolactone (ALDACTONE) 50 MG tablet   amLODipine (NORVASC) 5 MG tablet   Other Relevant Orders   Lipid panel   HTN (hypertension) - Primary    Increase Amlodipine to 5 mg po bid. Encouraged heart healthy diet such as the DASH diet and exercise as tolerated.       Relevant Medications   spironolactone (ALDACTONE) 50 MG tablet   amLODipine (NORVASC) 5 MG tablet   Other Relevant Orders   CBC   Comprehensive metabolic panel   TSH   Hyperglycemia    hgba1c acceptable, minimize simple  carbs. Increase exercise as tolerated.       Relevant Orders   Hemoglobin A1c   History of tobacco abuse    Still not smoking since 2017      Preventative health care    Patient encouraged to maintain heart healthy diet, regular exercise, adequate sleep. Consider daily probiotics. Take medications as prescribed. Check labs today      Female-to-female transgender person    Doing well on hormones, following with endocrinology. Has chosen not to undergo vaginoplasty but is considering orchioplasty so she can change her birth certificate identifier. Has not decided yet.          I have discontinued Mr. Gullickson spironolactone, doxycycline, and mupirocin ointment. I have also changed his amLODipine. Additionally, I am having him start on  spironolactone. Lastly, I am having him maintain his aspirin, KRILL OIL PO, lactobacillus acidophilus, multivitamin, psyllium, estradiol, MELATONIN PO, Biotin, and atorvastatin. We will stop administering sodium chloride.  Meds ordered this encounter  Medications  . spironolactone (ALDACTONE) 50 MG tablet    Sig: Take 1 tablet (50 mg total) by mouth daily.    Dispense:  90 tablet    Refill:  1  . amLODipine (NORVASC) 5 MG tablet    Sig: Take 1 tablet (5 mg total) by mouth 2 (two) times daily.    Dispense:  180 tablet    Refill:  1    CMA served as scribe during this visit. History, Physical and Plan performed by medical provider. Documentation and orders reviewed and attested to.  Penni Homans, MD

## 2017-09-24 NOTE — Assessment & Plan Note (Signed)
hgba1c acceptable, minimize simple carbs. Increase exercise as tolerated.  

## 2017-09-24 NOTE — Assessment & Plan Note (Addendum)
Increase Amlodipine to 5 mg po bid. Encouraged heart healthy diet such as the DASH diet and exercise as tolerated.

## 2017-10-23 DIAGNOSIS — E291 Testicular hypofunction: Secondary | ICD-10-CM | POA: Diagnosis not present

## 2018-02-05 ENCOUNTER — Encounter: Payer: Self-pay | Admitting: Endocrinology

## 2018-02-05 ENCOUNTER — Ambulatory Visit: Payer: PPO | Admitting: Endocrinology

## 2018-02-05 VITALS — BP 114/80 | HR 75 | Ht 68.0 in | Wt 204.0 lb

## 2018-02-05 DIAGNOSIS — F640 Transsexualism: Secondary | ICD-10-CM | POA: Diagnosis not present

## 2018-02-05 DIAGNOSIS — Z789 Other specified health status: Secondary | ICD-10-CM

## 2018-02-05 NOTE — Patient Instructions (Addendum)
Please let me know if you want a referral to a urologist.   Please continue the same medications.  Please come back for a follow-up appointment in 1 year.

## 2018-02-05 NOTE — Progress Notes (Signed)
Subjective:    Patient ID: Taylor Newman, adult    DOB: 06/28/1951, 67 y.o.   MRN: [id number]  HPI Pt is referred by Dr Vella Raring, for transgender state (M to F).  Pt reports she first suspected the transgender state at age 16.  She had puberty at the age of 45.  She has never had surgery for this.  She goes to counseling on a ongoing basis.  She is physically but not legally separated from her wife.  She has 1 biological child, and 2 stepchildren.  She has no h/o DVT, liver disease, kidney disease, heart disease, CVA, diabetes, gallbladder disease, blood disorders, osteoporosis, or cancer.  She has been on estradiol and aldactone since 2017. She quit smoking in 2017.  She has moderate growth of the breasts, and assoc weight gain.  aldactone dosage has been limited by hyperkalemia.   Past Medical History:  Diagnosis Date  . Chest pain 01/12/2013  . Dyspnea 05/20/2017  . FHx: BRCA2 gene positive 02/20/2015   Sister with this and breast cancer.  . Grief reaction 07/15/2013  . Heart murmur   . History of chicken pox   . History of colon polyps   . History of tobacco abuse 01/13/2014   Last cigarettes 05/30/2016  . Hyperkalemia 02/11/2017  . Hyperlipidemia   . Hypertension   . Laceration of hand 10/24/2014  . Female-to-female transgender person 10/30/2016  . Female-to-female transgender person 10/30/2016   Sees endocrinology Dr Linward Natal at [facility] in [place]  . Preventative health care 08/13/2014  . Sun-damaged skin 01/13/2014  . Tobacco use disorder 01/13/2014    Past Surgical History:  Procedure Laterality Date  . COLONOSCOPY    . POLYPECTOMY    . TONSILLECTOMY  1954    Social History   Socioeconomic History  . Marital status: Married    Spouse name: Not on file  . Number of children: Not on file  . Years of education: Not on file  . Highest education level: Not on file  Social Needs  . Financial resource strain: Not on file  . Food insecurity - worry: Not on  file  . Food insecurity - inability: Not on file  . Transportation needs - medical: Not on file  . Transportation needs - non-medical: Not on file  Occupational History  . Not on file  Tobacco Use  . Smoking status: Former Smoker    Packs/day: 1.00    Years: 45.00    Pack years: 45.00    Types: Cigarettes    Last attempt to quit: 05/14/2016    Years since quitting: 1.7  . Smokeless tobacco: Never Used  . Tobacco comment: 1/2  ppd  Substance and Sexual Activity  . Alcohol use: Yes    Alcohol/week: 10.8 oz    Types: 18 Cans of beer per week  . Drug use: No  . Sexual activity: Not on file  Other Topics Concern  . Not on file  Social History Narrative  . Not on file    Current Outpatient Medications on File Prior to Visit  Medication Sig Dispense Refill  . amLODipine (NORVASC) 5 MG tablet Take 1 tablet (5 mg total) by mouth 2 (two) times daily. 180 tablet 1  . aspirin 81 MG chewable tablet Chew 81 mg by mouth daily.    Marland Kitchen atorvastatin (LIPITOR) 20 MG tablet TAKE 1 TABLET (20 MG) BY MOUTH DAILY. 90 tablet 1  . Biotin 5000 MCG TABS Take 1 tablet  by mouth daily.    Marland Kitchen estradiol (ESTRACE) 2 MG tablet Take 1 tablet by mouth daily.    Marland Kitchen KRILL OIL PO Take 1 capsule by mouth daily.    Marland Kitchen lactobacillus acidophilus (BACID) TABS tablet Take 1 tablet by mouth daily.    Marland Kitchen MELATONIN PO Take by mouth at bedtime as needed.    . Multiple Vitamin (MULTIVITAMIN) tablet Take 1 tablet by mouth daily.    . psyllium (REGULOID) 0.52 G capsule Take 0.52 g by mouth daily.    Marland Kitchen spironolactone (ALDACTONE) 50 MG tablet Take 1 tablet (50 mg total) by mouth daily. 90 tablet 1   No current facility-administered medications on file prior to visit.     Allergies  Allergen Reactions  . Lisinopril Cough    hoarseness    Family History  Problem Relation Age of Onset  . Colon cancer Father 85  . Hyperlipidemia Father   . Heart disease Father        stent placement  . Hypertension Father   . Cancer Father  68       colon with liver mets  . Diabetes Father   . Breast cancer Sister        BRCA2 positive  . Cancer Sister        twice, breast cancer, BRCA 2 gene mutation  . Hyperlipidemia Mother   . Alzheimer's disease Mother   . Hypertension Mother   . Diabetes Maternal Grandmother        father  . Cancer Son 23  . Colon cancer Son        Joslyn Hy - not biologically related  . Mental illness Son        bipolar  . Allergies Sister   . Breast cancer Paternal Aunt        2 paternal aunts with breast cancer postmenopausal  . Breast cancer Cousin        3 pat female first cousins with breast cancer  . Breast cancer Other        niece with breast cancer  . Prostate cancer Neg Hx   . Esophageal cancer Neg Hx   . Rectal cancer Neg Hx   . Stomach cancer Neg Hx     BP 114/80 (BP Location: Left Arm, Patient Position: Sitting, Cuff Size: Large)   Pulse 75   Ht '5\' 8"'$  (1.727 m)   Wt 204 lb (92.5 kg)   SpO2 98%   BMI 31.02 kg/m     Review of Systems Denies fatigue, headache, visual loss, chest pain, sob, nausea, hematuria, acne, easy bruising, seizure, edema, myalgias, anxiety, heat intolerance, rhinorrhea, and excessive diaphoresis.     Objective:   Physical Exam VS: see vs page GEN: no distress HEAD: head: no deformity eyes: no periorbital swelling, no proptosis external nose and ears are normal mouth: no lesion seen NECK: supple, thyroid is not enlarged CHEST WALL: no deformity LUNGS: clear to auscultation BREASTS:  Tanner 3 CV: reg rate and rhythm, no murmur ABD: abdomen is soft, nontender.  no hepatosplenomegaly.  not distended.  no hernia GENITALIA:  Normal female.  Testicles are normal size.   RECTAL: normal external and internal exam.  heme neg. PROSTATE:  Normal size.  No nodule MUSCULOSKELETAL: muscle bulk and strength are grossly normal.  no obvious joint swelling.  gait is normal and steady EXTEMITIES: No leg edema PULSES: no carotid bruit NEURO:  cn 2-12 grossly  intact.   readily moves all 4's.  sensation is  intact to touch on all 4's SKIN:  Normal texture and temperature.  No rash or suspicious lesion is visible.   NODES:  None palpable at the neck PSYCH: alert, well-oriented.  Does not appear anxious nor depressed.  Lab Results  Component Value Date   CREATININE 0.95 09/24/2017   BUN 10 09/24/2017   NA 134 (L) 09/24/2017   K 4.6 09/24/2017   CL 100 09/24/2017   CO2 25 09/24/2017   I have reviewed outside records, and summarized: Pt was noted to have transgender state, and referred here.  She fulfills criteria for transgender state.  Risks of E2 rx were discussed with patient  outside test results are reviewed:  (on estrace) E2=176 testosterone=undetectable     Assessment & Plan:  Transgender state, new to me.  I advised orchiectomy.    Patient Instructions  Please let me know if you want a referral to a urologist.   Please continue the same medications.  Please come back for a follow-up appointment in 1 year.

## 2018-03-14 ENCOUNTER — Other Ambulatory Visit: Payer: Self-pay | Admitting: Family Medicine

## 2018-03-14 DIAGNOSIS — E785 Hyperlipidemia, unspecified: Secondary | ICD-10-CM

## 2018-03-25 ENCOUNTER — Ambulatory Visit (INDEPENDENT_AMBULATORY_CARE_PROVIDER_SITE_OTHER): Payer: PPO | Admitting: Family Medicine

## 2018-03-25 ENCOUNTER — Encounter: Payer: Self-pay | Admitting: Family Medicine

## 2018-03-25 VITALS — BP 118/80 | HR 72 | Temp 97.6°F | Resp 18 | Wt 205.6 lb

## 2018-03-25 DIAGNOSIS — F640 Transsexualism: Secondary | ICD-10-CM | POA: Diagnosis not present

## 2018-03-25 DIAGNOSIS — I10 Essential (primary) hypertension: Secondary | ICD-10-CM | POA: Diagnosis not present

## 2018-03-25 DIAGNOSIS — R739 Hyperglycemia, unspecified: Secondary | ICD-10-CM | POA: Diagnosis not present

## 2018-03-25 DIAGNOSIS — R350 Frequency of micturition: Secondary | ICD-10-CM

## 2018-03-25 DIAGNOSIS — Z87891 Personal history of nicotine dependence: Secondary | ICD-10-CM | POA: Diagnosis not present

## 2018-03-25 DIAGNOSIS — E782 Mixed hyperlipidemia: Secondary | ICD-10-CM

## 2018-03-25 DIAGNOSIS — Z789 Other specified health status: Secondary | ICD-10-CM

## 2018-03-25 LAB — LIPID PANEL
CHOLESTEROL: 190 mg/dL (ref 0–200)
HDL: 57.9 mg/dL (ref >39.00)
LDL CALC: 100 mg/dL — AB (ref 0–99)
NonHDL: 131.73
TRIGLYCERIDES: 161 mg/dL — AB (ref 0.0–149.0)
Total CHOL/HDL Ratio: 3
VLDL: 32.2 mg/dL (ref 0.0–40.0)

## 2018-03-25 LAB — CBC
HEMATOCRIT: 42.4 % (ref 39.0–52.0)
HEMOGLOBIN: 14.7 g/dL (ref 13.0–17.0)
MCHC: 34.7 g/dL (ref 30.0–36.0)
MCV: 95.3 fl (ref 78.0–100.0)
PLATELETS: 303 10*3/uL (ref 150.0–400.0)
RBC: 4.45 Mil/uL (ref 4.22–5.81)
RDW: 13 % (ref 11.5–15.5)
WBC: 8.8 10*3/uL (ref 4.0–10.5)

## 2018-03-25 LAB — COMPREHENSIVE METABOLIC PANEL
ALT: 27 U/L (ref 0–53)
AST: 18 U/L (ref 0–37)
Albumin: 4.1 g/dL (ref 3.5–5.2)
Alkaline Phosphatase: 61 U/L (ref 39–117)
BILIRUBIN TOTAL: 0.4 mg/dL (ref 0.2–1.2)
BUN: 18 mg/dL (ref 6–23)
CALCIUM: 9.8 mg/dL (ref 8.4–10.5)
CHLORIDE: 100 meq/L (ref 96–112)
CO2: 25 meq/L (ref 19–32)
Creatinine, Ser: 0.88 mg/dL (ref 0.40–1.50)
GFR: 91.43 mL/min (ref >60.00)
Glucose, Bld: 113 mg/dL — ABNORMAL HIGH (ref 70–99)
POTASSIUM: 4.7 meq/L (ref 3.5–5.1)
Sodium: 130 mEq/L — ABNORMAL LOW (ref 135–145)
Total Protein: 6.8 g/dL (ref 6.0–8.3)

## 2018-03-25 LAB — URINALYSIS
Bilirubin Urine: NEGATIVE
HGB URINE DIPSTICK: NEGATIVE
KETONES UR: NEGATIVE
LEUKOCYTES UA: NEGATIVE
Nitrite: NEGATIVE
Specific Gravity, Urine: 1.01 (ref 1.000–1.030)
Total Protein, Urine: NEGATIVE
URINE GLUCOSE: NEGATIVE
UROBILINOGEN UA: 0.2 (ref 0.0–1.0)
pH: 5.5 (ref 5.0–8.0)

## 2018-03-25 LAB — TSH: TSH: 3.71 u[IU]/mL (ref 0.35–4.50)

## 2018-03-25 LAB — HEMOGLOBIN A1C: Hgb A1c MFr Bld: 6.2 % (ref 4.6–6.5)

## 2018-03-25 MED ORDER — SPIRONOLACTONE 50 MG PO TABS: 50.0000 mg | ORAL_TABLET | Freq: Every day | ORAL | 0 refills | Status: DC

## 2018-03-25 NOTE — Progress Notes (Signed)
Subjective:  I acted as a Education administrator for Dr. Charlett Blake. Taylor Newman, [place]  Patient ID: Taylor Newman, adult    DOB: Nov 02, 1951, 67 y.o.   MRN: [id number]  No chief complaint on file.   HPI  Patient is in today for a 6 month follow up and is doing very well. No recent febrile illness or hospitalizations. Is following with endocrinology and is considering surgery. No polydipsia but notes some urinary frequency. No dysuria or hematuria. Denies CP/palp/SOB/HA/congestion/fevers/GI c/o. Taking meds as prescribed  Patient Care Team: Mosie Lukes, MD as PCP - General (Family Medicine) Linward Natal, MD as Consulting Physician (Obstetrics and Gynecology)   Past Medical History:  Diagnosis Date  . Chest pain 01/12/2013  . Dyspnea 05/20/2017  . FHx: BRCA2 gene positive 02/20/2015   Sister with this and breast cancer.  . Grief reaction 07/15/2013  . Heart murmur   . History of chicken pox   . History of colon polyps   . History of tobacco abuse 01/13/2014   Last cigarettes 05/30/2016  . Hyperkalemia 02/11/2017  . Hyperlipidemia   . Hypertension   . Laceration of hand 10/24/2014  . Female-to-female transgender person 10/30/2016  . Female-to-female transgender person 10/30/2016   Sees endocrinology Dr Linward Natal at [facility] in [place]  . Preventative health care 08/13/2014  . Sun-damaged skin 01/13/2014  . Tobacco use disorder 01/13/2014    Past Surgical History:  Procedure Laterality Date  . COLONOSCOPY    . POLYPECTOMY    . TONSILLECTOMY  1954    Family History  Problem Relation Age of Onset  . Colon cancer Father 36  . Hyperlipidemia Father   . Heart disease Father        stent placement  . Hypertension Father   . Cancer Father 71       colon with liver mets  . Diabetes Father   . Breast cancer Sister        BRCA2 positive  . Cancer Sister        twice, breast cancer, BRCA 2 gene mutation  . Hyperlipidemia Mother   . Alzheimer's disease Mother   . Hypertension  Mother   . Diabetes Maternal Grandmother        father  . Cancer Son 95  . Colon cancer Son        Joslyn Hy - not biologically related  . Mental illness Son        bipolar  . Allergies Sister   . Breast cancer Paternal Aunt        2 paternal aunts with breast cancer postmenopausal  . Breast cancer Cousin        3 pat female first cousins with breast cancer  . Breast cancer Other        niece with breast cancer  . Prostate cancer Neg Hx   . Esophageal cancer Neg Hx   . Rectal cancer Neg Hx   . Stomach cancer Neg Hx     Social History   Socioeconomic History  . Marital status: Married    Spouse name: Not on file  . Number of children: Not on file  . Years of education: Not on file  . Highest education level: Not on file  Occupational History  . Not on file  Social Needs  . Financial resource strain: Not on file  . Food insecurity:    Worry: Not on file    Inability: Not on file  . Transportation needs:  Medical: Not on file    Non-medical: Not on file  Tobacco Use  . Smoking status: Former Smoker    Packs/day: 1.00    Years: 45.00    Pack years: 45.00    Types: Cigarettes    Last attempt to quit: 05/14/2016    Years since quitting: 1.8  . Smokeless tobacco: Never Used  . Tobacco comment: 1/2  ppd  Substance and Sexual Activity  . Alcohol use: Yes    Alcohol/week: 10.8 oz    Types: 18 Cans of beer per week  . Drug use: No  . Sexual activity: Not on file  Lifestyle  . Physical activity:    Days per week: Not on file    Minutes per session: Not on file  . Stress: Not on file  Relationships  . Social connections:    Talks on phone: Not on file    Gets together: Not on file    Attends religious service: Not on file    Active member of club or organization: Not on file    Attends meetings of clubs or organizations: Not on file    Relationship status: Not on file  . Intimate partner violence:    Fear of current or ex partner: Not on file    Emotionally  abused: Not on file    Physically abused: Not on file    Forced sexual activity: Not on file  Other Topics Concern  . Not on file  Social History Narrative  . Not on file    Outpatient Medications Prior to Visit  Medication Sig Dispense Refill  . amLODipine (NORVASC) 5 MG tablet Take 1 tablet (5 mg total) by mouth 2 (two) times daily. 180 tablet 1  . aspirin 81 MG chewable tablet Chew 81 mg by mouth daily.    Marland Kitchen atorvastatin (LIPITOR) 20 MG tablet TAKE 1 TABLET BY MOUTH DAILY. 90 tablet 0  . Biotin 5000 MCG TABS Take 1 tablet by mouth daily.    Marland Kitchen estradiol (ESTRACE) 2 MG tablet Take 1 tablet by mouth daily.    Marland Kitchen KRILL OIL PO Take 1 capsule by mouth daily.    Marland Kitchen lactobacillus acidophilus (BACID) TABS tablet Take 1 tablet by mouth daily.    Marland Kitchen MELATONIN PO Take by mouth at bedtime as needed.    . Multiple Vitamin (MULTIVITAMIN) tablet Take 1 tablet by mouth daily.    . psyllium (REGULOID) 0.52 G capsule Take 0.52 g by mouth daily.    Marland Kitchen spironolactone (ALDACTONE) 50 MG tablet Take 1 tablet (50 mg total) by mouth daily. 90 tablet 1   No facility-administered medications prior to visit.     Allergies  Allergen Reactions  . Lisinopril Cough    hoarseness    Review of Systems  Constitutional: Negative for fever and malaise/fatigue.  HENT: Negative for congestion.   Eyes: Negative for blurred vision.  Respiratory: Negative for shortness of breath.   Cardiovascular: Negative for chest pain, palpitations and leg swelling.  Gastrointestinal: Negative for abdominal pain, blood in stool and nausea.  Genitourinary: Negative for dysuria and frequency.  Musculoskeletal: Negative for falls.  Skin: Negative for rash.  Neurological: Negative for dizziness, loss of consciousness and headaches.  Endo/Heme/Allergies: Negative for environmental allergies.  Psychiatric/Behavioral: Negative for depression. The patient is not nervous/anxious.        Objective:    Physical Exam  Constitutional:  She is oriented to person, place, and time. She appears well-developed and well-nourished. No distress.  HENT:  Head: Normocephalic and atraumatic.  Nose: Nose normal.  Eyes: Right eye exhibits no discharge. Left eye exhibits no discharge.  Neck: Normal range of motion. Neck supple.  Cardiovascular: Normal rate and regular rhythm.  No murmur heard. Pulmonary/Chest: Effort normal and breath sounds normal.  Abdominal: Soft. Bowel sounds are normal. There is no tenderness.  Musculoskeletal: She exhibits no edema.  Neurological: She is alert and oriented to person, place, and time.  Skin: Skin is warm and dry.  Psychiatric: She has a normal mood and affect.  Nursing note and vitals reviewed.   BP 118/80 (BP Location: Left Arm, Patient Position: Sitting, Cuff Size: Normal)   Pulse 72   Temp 97.6 F (36.4 C) (Oral)   Resp 18   Wt 205 lb 9.6 oz (93.3 kg)   SpO2 98%   BMI 31.26 kg/m  Wt Readings from Last 3 Encounters:  03/25/18 205 lb 9.6 oz (93.3 kg)  02/05/18 204 lb (92.5 kg)  09/24/17 199 lb (90.3 kg)   BP Readings from Last 3 Encounters:  03/25/18 118/80  02/05/18 114/80  09/24/17 130/90     Immunization History  Administered Date(s) Administered  . Influenza-Unspecified 10/08/2013, 09/20/2014, 09/10/2015, 09/10/2016, 09/02/2017  . Pneumococcal Conjugate-13 02/11/2015, 10/30/2016  . Td 05/10/2013  . Tdap 11/03/2007  . Zoster 09/25/2010    Health Maintenance  Topic Date Due  . MAMMOGRAM  11/12/1999  . DEXA SCAN  11/11/2014  . PNA vac Low Risk Adult (2 of 2 - PPSV23) 10/30/2017  . INFLUENZA VACCINE  07/10/2018  . COLONOSCOPY  04/30/2020  . TETANUS/TDAP  05/11/2023  . Hepatitis C Screening  Completed    Lab Results  Component Value Date   WBC 8.8 03/25/2018   HGB 14.7 03/25/2018   HCT 42.4 03/25/2018   PLT 303.0 03/25/2018   GLUCOSE 113 (H) 03/25/2018   CHOL 190 03/25/2018   TRIG 161.0 (H) 03/25/2018   HDL 57.90 03/25/2018   LDLCALC 100 (H) 03/25/2018    ALT 27 03/25/2018   AST 18 03/25/2018   NA 130 (L) 03/25/2018   K 4.7 03/25/2018   CL 100 03/25/2018   CREATININE 0.88 03/25/2018   BUN 18 03/25/2018   CO2 25 03/25/2018   TSH 3.71 03/25/2018   PSA 0.39 10/30/2016   HGBA1C 6.2 03/25/2018   MICROALBUR <0.7 08/19/2015    Lab Results  Component Value Date   TSH 3.71 03/25/2018   Lab Results  Component Value Date   WBC 8.8 03/25/2018   HGB 14.7 03/25/2018   HCT 42.4 03/25/2018   MCV 95.3 03/25/2018   PLT 303.0 03/25/2018   Lab Results  Component Value Date   NA 130 (L) 03/25/2018   K 4.7 03/25/2018   CO2 25 03/25/2018   GLUCOSE 113 (H) 03/25/2018   BUN 18 03/25/2018   CREATININE 0.88 03/25/2018   BILITOT 0.4 03/25/2018   ALKPHOS 61 03/25/2018   AST 18 03/25/2018   ALT 27 03/25/2018   PROT 6.8 03/25/2018   ALBUMIN 4.1 03/25/2018   CALCIUM 9.8 03/25/2018   GFR 91.43 03/25/2018   Lab Results  Component Value Date   CHOL 190 03/25/2018   Lab Results  Component Value Date   HDL 57.90 03/25/2018   Lab Results  Component Value Date   LDLCALC 100 (H) 03/25/2018   Lab Results  Component Value Date   TRIG 161.0 (H) 03/25/2018   Lab Results  Component Value Date   CHOLHDL 3 03/25/2018   Lab Results  Component  Value Date   HGBA1C 6.2 03/25/2018         Assessment & Plan:   Problem List Items Addressed This Visit    Hyperlipidemia    Encouraged heart healthy diet, increase exercise, avoid trans fats, consider a krill oil cap daily      Relevant Medications   spironolactone (ALDACTONE) 50 MG tablet   Other Relevant Orders   Lipid panel (Completed)   HTN (hypertension)    Well controlled, no changes to meds. Encouraged heart healthy diet such as the DASH diet and exercise as tolerated.       Relevant Medications   spironolactone (ALDACTONE) 50 MG tablet   Other Relevant Orders   CBC (Completed)   Comprehensive metabolic panel (Completed)   TSH (Completed)   Hyperglycemia    hgba1c acceptable,  minimize simple carbs. Increase exercise as tolerated.       Relevant Orders   Hemoglobin A1c (Completed)   History of tobacco abuse    Continues to abstain      Increased urinary frequency    Urinalysis unremarkable. Report worsening symptoms      Female-to-female transgender person    Is following with endocrinology and is considering orchiectomy to complete transition. Then would proceed with legal changes.        Other Visit Diagnoses    Urinary frequency    -  Primary   Relevant Orders   Urinalysis (Completed)      I am having Braxton Feathers "Fernanda" maintain her aspirin, KRILL OIL PO, lactobacillus acidophilus, multivitamin, psyllium, estradiol, MELATONIN PO, Biotin, amLODipine, atorvastatin, and spironolactone.  Meds ordered this encounter  Medications  . spironolactone (ALDACTONE) 50 MG tablet    Sig: Take 1 tablet (50 mg total) by mouth daily.    Dispense:  90 tablet    Refill:  0    CMA served as scribe during this visit. History, Physical and Plan performed by medical provider. Documentation and orders reviewed and attested to.  Penni Homans, MD

## 2018-03-25 NOTE — Assessment & Plan Note (Signed)
Encouraged heart healthy diet, increase exercise, avoid trans fats, consider a krill oil cap daily 

## 2018-03-25 NOTE — Patient Instructions (Addendum)
MIND diet  Shingrix is the new shingles shot, 2 shots over 2-6 months    DASH Eating Plan DASH stands for "Dietary Approaches to Stop Hypertension." The DASH eating plan is a healthy eating plan that has been shown to reduce high blood pressure (hypertension). It may also reduce your risk for type 2 diabetes, heart disease, and stroke. The DASH eating plan may also help with weight loss. What are tips for following this plan? General guidelines  Avoid eating more than 2,300 mg (milligrams) of salt (sodium) a day. If you have hypertension, you may need to reduce your sodium intake to 1,500 mg a day.  Limit alcohol intake to no more than 1 drink a day for nonpregnant women and 2 drinks a day for men. One drink equals 12 oz of beer, 5 oz of wine, or 1 oz of hard liquor.  Work with your health care provider to maintain a healthy body weight or to lose weight. Ask what an ideal weight is for you.  Get at least 30 minutes of exercise that causes your heart to beat faster (aerobic exercise) most days of the week. Activities may include walking, swimming, or biking.  Work with your health care provider or diet and nutrition specialist (dietitian) to adjust your eating plan to your individual calorie needs. Reading food labels  Check food labels for the amount of sodium per serving. Choose foods with less than 5 percent of the Daily Value of sodium. Generally, foods with less than 300 mg of sodium per serving fit into this eating plan.  To find whole grains, look for the word "whole" as the first word in the ingredient list. Shopping  Buy products labeled as "low-sodium" or "no salt added."  Buy fresh foods. Avoid canned foods and premade or frozen meals. Cooking  Avoid adding salt when cooking. Use salt-free seasonings or herbs instead of table salt or sea salt. Check with your health care provider or pharmacist before using salt substitutes.  Do not fry foods. Cook foods using healthy  methods such as baking, boiling, grilling, and broiling instead.  Cook with heart-healthy oils, such as olive, canola, soybean, or sunflower oil. Meal planning   Eat a balanced diet that includes: ? 5 or more servings of fruits and vegetables each day. At each meal, try to fill half of your plate with fruits and vegetables. ? Up to 6-8 servings of whole grains each day. ? Less than 6 oz of lean meat, poultry, or fish each day. A 3-oz serving of meat is about the same size as a deck of cards. One egg equals 1 oz. ? 2 servings of low-fat dairy each day. ? A serving of nuts, seeds, or beans 5 times each week. ? Heart-healthy fats. Healthy fats called Omega-3 fatty acids are found in foods such as flaxseeds and coldwater fish, like sardines, salmon, and mackerel.  Limit how much you eat of the following: ? Canned or prepackaged foods. ? Food that is high in trans fat, such as fried foods. ? Food that is high in saturated fat, such as fatty meat. ? Sweets, desserts, sugary drinks, and other foods with added sugar. ? Full-fat dairy products.  Do not salt foods before eating.  Try to eat at least 2 vegetarian meals each week.  Eat more home-cooked food and less restaurant, buffet, and fast food.  When eating at a restaurant, ask that your food be prepared with less salt or no salt, if possible. What foods  are recommended? The items listed may not be a complete list. Talk with your dietitian about what dietary choices are best for you. Grains Whole-grain or whole-wheat bread. Whole-grain or whole-wheat pasta. Brown rice. [place]ena Morrow. Bulgur. Whole-grain and low-sodium cereals. Pita bread. Low-fat, low-sodium crackers. Whole-wheat flour tortillas. Vegetables Fresh or frozen vegetables (raw, steamed, roasted, or grilled). Low-sodium or reduced-sodium tomato and vegetable juice. Low-sodium or reduced-sodium tomato sauce and tomato paste. Low-sodium or reduced-sodium canned  vegetables. Fruits All fresh, dried, or frozen fruit. Canned fruit in natural juice (without added sugar). Meat and other protein foods Skinless chicken or [place]. Ground chicken or [place]. Pork with fat trimmed off. Fish and seafood. Egg whites. Dried beans, peas, or lentils. Unsalted nuts, nut butters, and seeds. Unsalted canned beans. Lean cuts of beef with fat trimmed off. Low-sodium, lean deli meat. Dairy Low-fat (1%) or fat-free (skim) milk. Fat-free, low-fat, or reduced-fat cheeses. Nonfat, low-sodium ricotta or cottage cheese. Low-fat or nonfat yogurt. Low-fat, low-sodium cheese. Fats and oils Soft margarine without trans fats. Vegetable oil. Low-fat, reduced-fat, or light mayonnaise and salad dressings (reduced-sodium). Canola, safflower, olive, soybean, and sunflower oils. Avocado. Seasoning and other foods Herbs. Spices. Seasoning mixes without salt. Unsalted popcorn and pretzels. Fat-free sweets. What foods are not recommended? The items listed may not be a complete list. Talk with your dietitian about what dietary choices are best for you. Grains Baked goods made with fat, such as croissants, muffins, or some breads. Dry pasta or rice meal packs. Vegetables Creamed or fried vegetables. Vegetables in a cheese sauce. Regular canned vegetables (not low-sodium or reduced-sodium). Regular canned tomato sauce and paste (not low-sodium or reduced-sodium). Regular tomato and vegetable juice (not low-sodium or reduced-sodium). Angie Fava. Olives. Fruits Canned fruit in a light or heavy syrup. Fried fruit. Fruit in cream or butter sauce. Meat and other protein foods Fatty cuts of meat. Ribs. Fried meat. Berniece Salines. Sausage. Bologna and other processed lunch meats. Salami. Fatback. Hotdogs. Bratwurst. Salted nuts and seeds. Canned beans with added salt. Canned or smoked fish. Whole eggs or egg yolks. Chicken or [place] with skin. Dairy Whole or 2% milk, cream, and half-and-half. Whole or full-fat  cream cheese. Whole-fat or sweetened yogurt. Full-fat cheese. Nondairy creamers. Whipped toppings. Processed cheese and cheese spreads. Fats and oils Butter. Stick margarine. Lard. Shortening. Ghee. Bacon fat. Tropical oils, such as coconut, palm kernel, or palm oil. Seasoning and other foods Salted popcorn and pretzels. Onion salt, garlic salt, seasoned salt, table salt, and sea salt. Worcestershire sauce. Tartar sauce. Barbecue sauce. Teriyaki sauce. Soy sauce, including reduced-sodium. Steak sauce. Canned and packaged gravies. Fish sauce. Oyster sauce. Cocktail sauce. Horseradish that you find on the shelf. Ketchup. Mustard. Meat flavorings and tenderizers. Bouillon cubes. Hot sauce and Tabasco sauce. Premade or packaged marinades. Premade or packaged taco seasonings. Relishes. Regular salad dressings. Where to find more information:  National Heart, Lung, and [place]: https://wilson-eaton.com/  American Heart Association: www.heart.org Summary  The DASH eating plan is a healthy eating plan that has been shown to reduce high blood pressure (hypertension). It may also reduce your risk for type 2 diabetes, heart disease, and stroke.  With the DASH eating plan, you should limit salt (sodium) intake to 2,300 mg a day. If you have hypertension, you may need to reduce your sodium intake to 1,500 mg a day.  When on the DASH eating plan, aim to eat more fresh fruits and vegetables, whole grains, lean proteins, low-fat dairy, and heart-healthy fats.  Work with your  health care provider or diet and nutrition specialist (dietitian) to adjust your eating plan to your individual calorie needs. This information is not intended to replace advice given to you by your health care provider. Make sure you discuss any questions you have with your health care provider. Document Released: 11/15/2011 Document Revised: 11/19/2016 Document Reviewed: 11/19/2016 Elsevier Interactive Patient Education  [employer].

## 2018-03-25 NOTE — Assessment & Plan Note (Signed)
Continues to abstain 

## 2018-03-25 NOTE — Assessment & Plan Note (Signed)
Is following with endocrinology and is considering orchiectomy to complete transition. Then would proceed with legal changes.

## 2018-03-25 NOTE — Assessment & Plan Note (Signed)
Well controlled, no changes to meds. Encouraged heart healthy diet such as the DASH diet and exercise as tolerated.  °

## 2018-03-25 NOTE — Assessment & Plan Note (Signed)
hgba1c acceptable, minimize simple carbs. Increase exercise as tolerated.  

## 2018-03-31 NOTE — Assessment & Plan Note (Signed)
Urinalysis unremarkable. Report worsening symptoms

## 2018-04-16 ENCOUNTER — Other Ambulatory Visit: Payer: Self-pay | Admitting: Family Medicine

## 2018-05-20 DIAGNOSIS — L600 Ingrowing nail: Secondary | ICD-10-CM | POA: Diagnosis not present

## 2018-06-13 ENCOUNTER — Other Ambulatory Visit: Payer: Self-pay | Admitting: Family Medicine

## 2018-06-13 DIAGNOSIS — E785 Hyperlipidemia, unspecified: Secondary | ICD-10-CM

## 2018-06-17 ENCOUNTER — Other Ambulatory Visit: Payer: Self-pay

## 2018-06-17 ENCOUNTER — Emergency Department (INDEPENDENT_AMBULATORY_CARE_PROVIDER_SITE_OTHER): Payer: PPO

## 2018-06-17 ENCOUNTER — Emergency Department (INDEPENDENT_AMBULATORY_CARE_PROVIDER_SITE_OTHER)
Admission: EM | Admit: 2018-06-17 | Discharge: 2018-06-17 | Disposition: A | Discharge status: Home / Self Care | Payer: PPO | Source: Home / Self Care | Attending: Family Medicine | Admitting: Family Medicine

## 2018-06-17 DIAGNOSIS — M25572 Pain in left ankle and joints of left foot: Secondary | ICD-10-CM

## 2018-06-17 DIAGNOSIS — S93402A Sprain of unspecified ligament of left ankle, initial encounter: Secondary | ICD-10-CM

## 2018-06-17 NOTE — ED Triage Notes (Signed)
Pt was on the treadmill a little over a week ago and lost balance.  The last few days has been swollen and throbbing at night.  Most of the swelling has resolved by am per patient.

## 2018-06-17 NOTE — Discharge Instructions (Signed)
°  You may continue to take 500mg  acetaminophen every 4-6 hours or in combination with ibuprofen 400-600mg  every 6-8 hours as needed for pain and inflammation.  You may want to wear an ankle brace or apply an ace bandage while at work to help give your ankle extra support while it is healing.  As the pain eases off, you can start home ankle exercises to strengthen your ankle.    Please follow up with family medicine or sports medicine if not improving in 1-2 weeks, sooner if worsening.

## 2018-06-17 NOTE — ED Provider Notes (Signed)
[facility]    CSN: [id number] Arrival date & time: 06/17/18  0801     History   Chief Complaint Chief Complaint  Patient presents with  . Ankle Pain    left    HPI Taylor Newman is a 67 y.o. adult.   HPI Taylor Newman is a 67 y.o. adult presenting to UC with c/o Left ankle pain and swelling with swelling into the foot.  She reports injuring it about 1-2 weeks ago on a treadmill after loosing her balance. Pain was aching and sore but resolved after elevating that evening.  Over the last few days, pain and swelling has worsened, especially after being on her feet all day at work.  Pain is throbbing, moderate in severity at times.  No prior fracture or surgery to same ankle.     Past Medical History:  Diagnosis Date  . Chest pain 01/12/2013  . Dyspnea 05/20/2017  . FHx: BRCA2 gene positive 02/20/2015   Sister with this and breast cancer.  . Grief reaction 07/15/2013  . Heart murmur   . History of chicken pox   . History of colon polyps   . History of tobacco abuse 01/13/2014   Last cigarettes 05/30/2016  . Hyperkalemia 02/11/2017  . Hyperlipidemia   . Hypertension   . Laceration of hand 10/24/2014  . Female-to-female transgender person 10/30/2016  . Female-to-female transgender person 10/30/2016   Sees endocrinology Dr Linward Natal at [facility] in [place]  . Preventative health care 08/13/2014  . Sun-damaged skin 01/13/2014  . Tobacco use disorder 01/13/2014    Patient Active Problem List   Diagnosis Date Noted  . Dyspnea 05/20/2017  . Hyperkalemia 02/11/2017  . Increased urinary frequency 10/30/2016  . Female-to-female transgender person 10/30/2016  . Family history of BRCA2 gene positive 03/25/2015  . History of colonic polyps 03/25/2015  . Family history of malignant neoplasm of gastrointestinal tract 03/25/2015  . Family history of malignant neoplasm of breast 03/25/2015  . FHx: BRCA2 gene positive 02/20/2015  . Preventative health care  08/13/2014  . Sun-damaged skin 01/13/2014  . History of tobacco abuse 01/13/2014  . Grief reaction 07/15/2013  . Otitis externa, acute eczematoid 12/25/2012  . Hyperglycemia 07/14/2012  . Colon polyps 01/18/2012  . Hyperlipidemia 01/18/2012  . HTN (hypertension) 01/18/2012    Past Surgical History:  Procedure Laterality Date  . COLONOSCOPY    . POLYPECTOMY    . TONSILLECTOMY  1954       Home Medications    Prior to Admission medications   Medication Sig Start Date End Date Taking? Authorizing Provider  amLODipine (NORVASC) 5 MG tablet TAKE 1 TABLET (5 MG TOTAL) BY MOUTH 2 (TWO) TIMES DAILY. 04/17/18   Mosie Lukes, MD  aspirin 81 MG chewable tablet Chew 81 mg by mouth daily.    [provider]  atorvastatin (LIPITOR) 20 MG tablet TAKE 1 TABLET BY MOUTH DAILY. 06/13/18   Ann Held, DO  Biotin 5000 MCG TABS Take 1 tablet by mouth daily.    [provider]  estradiol (ESTRACE) 2 MG tablet Take 1 tablet by mouth daily. 10/08/16   [provider]  KRILL OIL PO Take 1 capsule by mouth daily.    [provider]  lactobacillus acidophilus (BACID) TABS tablet Take 1 tablet by mouth daily.    [provider]  MELATONIN PO Take by mouth at bedtime as needed.    [provider]  Multiple Vitamin (  MULTIVITAMIN) tablet Take 1 tablet by mouth daily.    [provider]  psyllium (REGULOID) 0.52 G capsule Take 0.52 g by mouth daily.    [provider]  spironolactone (ALDACTONE) 50 MG tablet Take 1 tablet (50 mg total) by mouth daily. 03/25/18   Mosie Lukes, MD    Family History Family History  Problem Relation Age of Onset  . Colon cancer Father 23  . Hyperlipidemia Father   . Heart disease Father        stent placement  . Hypertension Father   . Cancer Father 11       colon with liver mets  . Diabetes Father   . Breast cancer Sister        BRCA2 positive  . Cancer Sister        twice, breast  cancer, BRCA 2 gene mutation  . Hyperlipidemia Mother   . Alzheimer's disease Mother   . Hypertension Mother   . Diabetes Maternal Grandmother        father  . Cancer Son 52  . Colon cancer Son        Joslyn Hy - not biologically related  . Mental illness Son        bipolar  . Allergies Sister   . Breast cancer Paternal Aunt        2 paternal aunts with breast cancer postmenopausal  . Breast cancer Cousin        3 pat female first cousins with breast cancer  . Breast cancer Other        niece with breast cancer  . Prostate cancer Neg Hx   . Esophageal cancer Neg Hx   . Rectal cancer Neg Hx   . Stomach cancer Neg Hx     Social History Social History   Tobacco Use  . Smoking status: Former Smoker    Packs/day: 1.00    Years: 45.00    Pack years: 45.00    Types: Cigarettes    Last attempt to quit: 05/14/2016    Years since quitting: 2.0  . Smokeless tobacco: Never Used  . Tobacco comment: 1/2  ppd  Substance Use Topics  . Alcohol use: Yes    Alcohol/week: 10.8 oz    Types: 18 Cans of beer per week  . Drug use: No     Allergies   Lisinopril   Review of Systems Review of Systems  Musculoskeletal: Positive for arthralgias, joint swelling and myalgias.  Skin: Negative for color change and wound.     Physical Exam Triage Vital Signs ED Triage Vitals  Enc Vitals Group     BP 06/17/18 0816 (!) 148/92     Pulse Rate 06/17/18 0816 74     Resp --      Temp 06/17/18 0816 (!) 97.4 F (36.3 C)     Temp Source 06/17/18 0816 Oral     SpO2 06/17/18 0816 98 %     Weight 06/17/18 0817 200 lb (90.7 kg)     Height 06/17/18 0817 '5\' 9"'$  (1.753 m)     Head Circumference --      Peak Flow --      Pain Score --      Pain Loc --      Pain Edu? --      Excl. in [place]? --    No data found.  Updated Vital Signs BP (!) 148/92 (BP Location: Right Arm)   Pulse 74   Temp (!) 97.4 F (  36.3 C) (Oral)   Ht '5\' 9"'$  (1.753 m)   Wt 200 lb (90.7 kg)   SpO2 98%   BMI 29.53 kg/m    Visual Acuity Right Eye Distance:   Left Eye Distance:   Bilateral Distance:    Right Eye Near:   Left Eye Near:    Bilateral Near:     Physical Exam  Constitutional: She is oriented to person, place, and time. She appears well-developed and well-nourished. No distress.  HENT:  Head: Normocephalic and atraumatic.  Eyes: EOM are normal.  Neck: Normal range of motion.  Cardiovascular: Normal rate.  Pulmonary/Chest: Effort normal.  Musculoskeletal: Normal range of motion. She exhibits edema and tenderness.  Left ankle and foot: moderate edema, tenderness to anterior dorsal aspect of ankle. Full ROM. No crepitus. No tenderness to the foot or toes. Calf is soft, non-tender.  Neurological: She is alert and oriented to person, place, and time.  Skin: Skin is warm and dry. Capillary refill takes less than 2 seconds. She is not diaphoretic.  Left ankle and foot: skin in tact. No ecchymosis or erythema.  Psychiatric: She has a normal mood and affect. Her behavior is normal.  Nursing note and vitals reviewed.    UC Treatments / Results  Labs (all labs ordered are listed, but only abnormal results are displayed) Labs Reviewed - No data to display  EKG None  Radiology Dg Ankle Complete Left  Result Date: 06/17/2018 CLINICAL DATA:  Lateral ankle pain. EXAM: LEFT ANKLE COMPLETE - 3+ VIEW COMPARISON:  None. FINDINGS: There is no evidence of fracture, dislocation, or joint effusion. There is no evidence of arthropathy or other focal bone abnormality. Soft tissues are unremarkable. IMPRESSION: No acute osseous injury of the left ankle. Electronically Signed   By: Kathreen Devoid   On: 06/17/2018 08:51    Procedures Procedures (including critical care time)  Medications Ordered in UC Medications - No data to display  Initial Impression / Assessment and Plan / UC Course  I have reviewed the triage vital signs and the nursing notes.  Pertinent labs & imaging results that were available  during my care of the patient were reviewed by me and considered in my medical decision making (see chart for details).     Hx and exam c/w Left ankle sprain. ASO splint applied for comfort. Pt info packet provided.  Final Clinical Impressions(s) / UC Diagnoses   Final diagnoses:  Sprain of left ankle, unspecified ligament, initial encounter     Discharge Instructions      You may continue to take '500mg'$  acetaminophen every 4-6 hours or in combination with ibuprofen 400-'600mg'$  every 6-8 hours as needed for pain and inflammation.  You may want to wear an ankle brace or apply an ace bandage while at work to help give your ankle extra support while it is healing.  As the pain eases off, you can start home ankle exercises to strengthen your ankle.    Please follow up with family medicine or sports medicine if not improving in 1-2 weeks, sooner if worsening.      ED Prescriptions    None     Controlled Substance Prescriptions [place] Controlled Substance Registry consulted? Not Applicable   Noe Gens, PA-C 06/17/18 0930

## 2018-06-26 ENCOUNTER — Other Ambulatory Visit: Payer: Self-pay

## 2018-06-26 ENCOUNTER — Telehealth: Payer: Self-pay | Admitting: Endocrinology

## 2018-06-26 MED ORDER — SPIRONOLACTONE 50 MG PO TABS: 50.0000 mg | ORAL_TABLET | Freq: Every day | ORAL | 99 refills | Status: DC

## 2018-06-26 MED ORDER — ESTRADIOL 2 MG PO TABS: 2.0000 mg | ORAL_TABLET | Freq: Every day | ORAL | 99 refills | Status: DC

## 2018-06-26 NOTE — Telephone Encounter (Signed)
Ok, please refill both prn

## 2018-06-26 NOTE — Telephone Encounter (Signed)
I have refilled both PRN.

## 2018-06-26 NOTE — Telephone Encounter (Signed)
spironolactone (ALDACTONE) 50 MG tablet    estradiol (ESTRACE) 2 MG tablet   Patient would like a 90 day supply sent in for the above prescriptions.     [place], [place]

## 2018-08-12 ENCOUNTER — Ambulatory Visit: Payer: PPO | Admitting: *Deleted

## 2018-10-06 ENCOUNTER — Encounter: Payer: PPO | Admitting: Family Medicine

## 2018-10-07 ENCOUNTER — Ambulatory Visit: Payer: PPO | Admitting: *Deleted

## 2018-10-15 ENCOUNTER — Other Ambulatory Visit: Payer: Self-pay | Admitting: Family Medicine

## 2018-10-15 NOTE — Telephone Encounter (Signed)
Amlodopine refill request left pended for PCP review. Pt. Has cancelled several recent appointments, LOV 06/2018, next visit scheduled for 12/9. Last CMP/CBC 03/2018.

## 2018-10-30 ENCOUNTER — Other Ambulatory Visit: Payer: Self-pay | Admitting: Family Medicine

## 2018-10-30 MED ORDER — MUPIROCIN 2 % EX OINT: TOPICAL_OINTMENT | CUTANEOUS | 0 refills | Status: DC

## 2018-10-30 MED ORDER — CEPHALEXIN 500 MG PO CAPS: 500.0000 mg | ORAL_CAPSULE | Freq: Three times a day (TID) | ORAL | 0 refills | Status: DC

## 2018-10-30 NOTE — Progress Notes (Signed)
Had an artificial nail removed yesterday and part of his nail pulled off and patient  has a sore thumb but no obvious sign of infection such as redness, discharge or warmth. Thumb nail. Is encouraged to soak in H2O2 daily and apply Mupirocin and and bandaid daily. Given Keflex to use if infection develops.

## 2018-11-17 ENCOUNTER — Encounter: Payer: Self-pay | Admitting: Family Medicine

## 2018-11-17 ENCOUNTER — Ambulatory Visit (INDEPENDENT_AMBULATORY_CARE_PROVIDER_SITE_OTHER): Payer: PPO | Admitting: Family Medicine

## 2018-11-17 DIAGNOSIS — Z23 Encounter for immunization: Secondary | ICD-10-CM | POA: Diagnosis not present

## 2018-11-17 DIAGNOSIS — H60543 Acute eczematoid otitis externa, bilateral: Secondary | ICD-10-CM

## 2018-11-17 DIAGNOSIS — Z0000 Encounter for general adult medical examination without abnormal findings: Secondary | ICD-10-CM | POA: Diagnosis not present

## 2018-11-17 DIAGNOSIS — E782 Mixed hyperlipidemia: Secondary | ICD-10-CM | POA: Diagnosis not present

## 2018-11-17 DIAGNOSIS — R350 Frequency of micturition: Secondary | ICD-10-CM | POA: Diagnosis not present

## 2018-11-17 DIAGNOSIS — I10 Essential (primary) hypertension: Secondary | ICD-10-CM | POA: Diagnosis not present

## 2018-11-17 DIAGNOSIS — R739 Hyperglycemia, unspecified: Secondary | ICD-10-CM | POA: Diagnosis not present

## 2018-11-17 LAB — LIPID PANEL
Cholesterol: 179 mg/dL (ref 0–200)
HDL: 57.7 mg/dL (ref >39.00)
LDL Cholesterol: 98 mg/dL (ref 0–99)
NonHDL: 120.95
TRIGLYCERIDES: 114 mg/dL (ref 0.0–149.0)
Total CHOL/HDL Ratio: 3
VLDL: 22.8 mg/dL (ref 0.0–40.0)

## 2018-11-17 LAB — CBC
HCT: 44.6 % (ref 39.0–52.0)
Hemoglobin: 15.1 g/dL (ref 13.0–17.0)
MCHC: 33.9 g/dL (ref 30.0–36.0)
MCV: 96.7 fl (ref 78.0–100.0)
Platelets: 314 10*3/uL (ref 150.0–400.0)
RBC: 4.61 Mil/uL (ref 4.22–5.81)
RDW: 13.1 % (ref 11.5–15.5)
WBC: 8.4 10*3/uL (ref 4.0–10.5)

## 2018-11-17 LAB — COMPREHENSIVE METABOLIC PANEL
ALBUMIN: 4.5 g/dL (ref 3.5–5.2)
ALK PHOS: 73 U/L (ref 39–117)
ALT: 35 U/L (ref 0–53)
AST: 24 U/L (ref 0–37)
BILIRUBIN TOTAL: 0.5 mg/dL (ref 0.2–1.2)
BUN: 16 mg/dL (ref 6–23)
CALCIUM: 10.1 mg/dL (ref 8.4–10.5)
CO2: 28 mEq/L (ref 19–32)
CREATININE: 0.85 mg/dL (ref 0.40–1.50)
Chloride: 103 mEq/L (ref 96–112)
GFR: 94.99 mL/min (ref >60.00)
Glucose, Bld: 127 mg/dL — ABNORMAL HIGH (ref 70–99)
Potassium: 5.6 mEq/L — ABNORMAL HIGH (ref 3.5–5.1)
SODIUM: 138 meq/L (ref 135–145)
TOTAL PROTEIN: 6.8 g/dL (ref 6.0–8.3)

## 2018-11-17 LAB — HEMOGLOBIN A1C: Hgb A1c MFr Bld: 6.3 % (ref 4.6–6.5)

## 2018-11-17 LAB — PSA: PSA: 0.02 ng/mL — ABNORMAL LOW (ref 0.10–4.00)

## 2018-11-17 LAB — TSH: TSH: 3.76 u[IU]/mL (ref 0.35–4.50)

## 2018-11-17 MED ORDER — FLUOCINOLONE ACETONIDE 0.01 % OT OIL: 5.0000 [drp] | TOPICAL_OIL | Freq: Two times a day (BID) | OTIC | 1 refills | Status: DC

## 2018-11-17 NOTE — Assessment & Plan Note (Signed)
Check PSA. ?

## 2018-11-17 NOTE — Assessment & Plan Note (Signed)
hgba1c acceptable, minimize simple carbs. Increase exercise as tolerated.  

## 2018-11-17 NOTE — Progress Notes (Addendum)
Subjective:   Taylor Newman is a 67 y.o. female who presents for Medicare Annual (Subsequent) preventive examination.  Review of Systems: No ROS.  Medicare Wellness Visit. Additional risk factors are reflected in the social history. Cardiac Risk Factors include: advanced age (>3mn, >>68women);dyslipidemia;hypertension;obesity (BMI >30kg/m2) Sleep patterns: Takes Melatonin occasionally. Sleeps 5-7 hrs. Feels rested. Home Safety/Smoke Alarms: Feels safe in home. Smoke alarms in place. Lives alone in ground floor apt.  Female:       Mammo- pt checking with insurance about coverage      Dexa scan-    pt checking with insurance about coverage           Female:   CCS- Last 02/2017 with 3 yr recall per pt. Dr.Pyrtle  PSA-  Lab Results  Component Value Date   PSA 0.02 (L) 11/17/2018   PSA 0.39 10/30/2016   PSA 0.26 08/19/2015       Objective:     Vitals: BP 138/86 (BP Location: Left Arm, Patient Position: Sitting, Cuff Size: Normal)   Pulse 78   Ht '5\' 8"'$  (1.727 m)   Wt 209 lb 6.4 oz (95 kg)   SpO2 98%   BMI 31.84 kg/m   Body mass index is 31.84 kg/m.  Advanced Directives 11/18/2018 09/24/2017 04/30/2017 04/16/2017 02/11/2017  Does Patient Have a Medical Advance Directive? Yes Yes Yes Yes No  Type of AParamedicof ASan MarcosLiving will [place]irardvilleLiving will [place]inertLiving will [place]Quebrad[place]Living will -  Does patient want to make changes to medical advance directive? - - - - No - Patient declined  Copy of [place] Chart? Yes - validated most recent copy scanned in chart (See row information) Yes - - -    Tobacco Social History   Tobacco Use  Smoking Status Former Smoker  . Packs/day: 1.00  . Years: 45.00  . Pack years: 45.00  . Types: Cigarettes  . Last attempt to quit: 05/14/2016  . Years since quitting: 2.5  Smokeless Tobacco Never Used  Tobacco Comment   1/2  ppd    Counseling given: Not Answered Comment: 1/2  ppd   Clinical Intake:     Pain : No/denies pain                 Past Medical History:  Diagnosis Date  . Chest pain 01/12/2013  . Dyspnea 05/20/2017  . FHx: BRCA2 gene positive 02/20/2015   Sister with this and breast cancer.  . Grief reaction 07/15/2013  . Heart murmur   . History of chicken pox   . History of colon polyps   . History of tobacco abuse 01/13/2014   Last cigarettes 05/30/2016  . Hyperkalemia 02/11/2017  . Hyperlipidemia   . Hypertension   . Laceration of hand 10/24/2014  . Female-to-female transgender person 10/30/2016  . Female-to-female transgender person 10/30/2016   [facility] [place] . Preventative health care 08/13/2014  . Sun-damaged skin 01/13/2014  . Tobacco use disorder 01/13/2014   Past Surgical History:  Procedure Laterality Date  . COLONOSCOPY    . POLYPECTOMY    . TONSILLECTOMY  1954   Family History  Problem Relation Age of Onset  . Colon cancer Father 7103 . Hyperlipidemia Father   . Heart disease Father        stent placement  . Hypertension Father   . Cancer Father  74       colon with liver mets  . Diabetes Father   . Breast cancer Sister        BRCA2 positive  . Cancer Sister        twice, breast cancer, BRCA 2 gene mutation  . Hyperlipidemia Mother   . Alzheimer's disease Mother   . Hypertension Mother   . Diabetes Maternal Grandmother        father  . Cancer Son 18  . Colon cancer Son        Joslyn Hy - not biologically related  . Mental illness Son        bipolar  . Allergies Sister   . Breast cancer Paternal Aunt        2 paternal aunts with breast cancer postmenopausal  . Breast cancer Cousin        3 pat female first cousins with breast cancer  . Breast cancer Other        niece with breast cancer  . Prostate cancer Neg Hx   . Esophageal cancer Neg Hx   . Rectal cancer Neg Hx   . Stomach cancer Neg Hx     Social History   Socioeconomic History  . Marital status: Married    Spouse name: Not on file  . Number of children: Not on file  . Years of education: Not on file  . Highest education level: Not on file  Occupational History  . Not on file  Social Needs  . Financial resource strain: Not on file  . Food insecurity:    Worry: Not on file    Inability: Not on file  . Transportation needs:    Medical: Not on file    Non-medical: Not on file  Tobacco Use  . Smoking status: Former Smoker    Packs/day: 1.00    Years: 45.00    Pack years: 45.00    Types: Cigarettes    Last attempt to quit: 05/14/2016    Years since quitting: 2.5  . Smokeless tobacco: Never Used  . Tobacco comment: 1/2  ppd  Substance and Sexual Activity  . Alcohol use: Yes    Comment: Drinks weekly  . Drug use: No  . Sexual activity: Not on file  Lifestyle  . Physical activity:    Days per week: Not on file    Minutes per session: Not on file  . Stress: Not on file  Relationships  . Social connections:    Talks on phone: Not on file    Gets together: Not on file    Attends religious service: Not on file    Active member of club or organization: Not on file    Attends meetings of clubs or organizations: Not on file    Relationship status: Not on file  Other Topics Concern  . Not on file  Social History Narrative  . Not on file    Outpatient Encounter Medications as of 11/18/2018  Medication Sig  . amLODipine (NORVASC) 5 MG tablet Take 1 tablet (5 mg total) by mouth 2 (two) times daily.  Marland Kitchen aspirin 81 MG chewable tablet Chew 81 mg by mouth daily.  Marland Kitchen atorvastatin (LIPITOR) 20 MG tablet TAKE 1 TABLET BY MOUTH DAILY.  Marland Kitchen Biotin 5000 MCG TABS Take 1 tablet by mouth daily.  Marland Kitchen estradiol (ESTRACE) 2 MG tablet Take 1 tablet (2 mg total) by mouth daily.  . Fluocinolone Acetonide 0.01 % OIL Place 5 drops in ear(s) 2 (two) times  daily.  Marland Kitchen KRILL OIL PO Take 1 capsule by mouth daily.  Marland Kitchen lactobacillus  acidophilus (BACID) TABS tablet Take 1 tablet by mouth daily.  Marland Kitchen MELATONIN PO Take by mouth at bedtime as needed.  . Multiple Vitamin (MULTIVITAMIN) tablet Take 1 tablet by mouth daily.  . mupirocin ointment (BACTROBAN) 2 % Apply to thumb 3 times daily for 5 days  . psyllium (REGULOID) 0.52 G capsule Take 0.52 g by mouth daily.  Marland Kitchen spironolactone (ALDACTONE) 50 MG tablet Take 1 tablet (50 mg total) by mouth daily.  . cephALEXin (KEFLEX) 500 MG capsule Take 1 capsule (500 mg total) by mouth 3 (three) times daily. (Patient not taking: Reported on 11/18/2018)   No facility-administered encounter medications on file as of 11/18/2018.     Activities of Daily Living In your present state of health, do you have any difficulty performing the following activities: 11/18/2018  Hearing? N  Vision? N  Difficulty concentrating or making decisions? N  Walking or climbing stairs? N  Dressing or bathing? N  Doing errands, shopping? N  Preparing Food and eating ? N  Using the Toilet? N  In the past six months, have you accidently leaked urine? N  Do you have problems with loss of bowel control? N  Managing your Medications? N  Managing your Finances? N  Housekeeping or managing your Housekeeping? N  Some recent data might be hidden    Patient Care Team: Mosie Lukes, MD as PCP - General (Family Medicine) Linward Natal, MD as Consulting Physician (Obstetrics and Gynecology)    Assessment:   This is a routine wellness examination for Taylor Newman. Physical assessment deferred to PCP.  Exercise Activities and Dietary recommendations Current Exercise Habits: The patient does not participate in regular exercise at present, Exercise limited by: None identified Diet (meal preparation, eat out, water intake, caffeinated beverages, dairy products, fruits and vegetables): 24 hour recall Breakfast: coffee 3-4 c of decaf Lunch: sandwich, veggies, or fruit Dinner:  pizza  Goals    . Increase physical  activity    . Reduce alcohol intake       Fall Risk Fall Risk  11/18/2018 11/17/2018 09/24/2017 02/11/2017 10/30/2016  Falls in the past year? 0 0 No No No    Depression Screen PHQ 2/9 Scores 11/18/2018 11/17/2018 09/24/2017 02/11/2017  PHQ - 2 Score 0 0 0 0     Cognitive Function Ad8 score reviewed for issues:  Issues making decisions:no  Less interest in hobbies / activities:no  Repeats questions, stories (family complaining):no  Trouble using ordinary gadgets (microwave, computer, phone):no  Forgets the month or year: no  Mismanaging finances: no  Remembering appts:no  Daily problems with thinking and/or memory:no Ad8 score is=0         Immunization History  Administered Date(s) Administered  . Influenza-Unspecified 10/08/2013, 09/20/2014, 09/10/2015, 09/10/2016, 09/02/2017  . Pneumococcal Conjugate-13 02/11/2015, 10/30/2016  . Pneumococcal Polysaccharide-23 11/17/2018  . Td 05/10/2013  . Tdap 11/03/2007  . Zoster 09/25/2010    Screening Tests Health Maintenance  Topic Date Due  . DEXA SCAN  11/11/2014  . MAMMOGRAM  05/11/2019 (Originally 11/12/1999)  . COLONOSCOPY  04/30/2020  . TETANUS/TDAP  05/11/2023  . INFLUENZA VACCINE  Completed  . Hepatitis C Screening  Completed  . PNA vac Low Risk Adult  Completed      Plan:    Please schedule your next medicare wellness visit with me in 1 yr.  Continue to eat heart healthy diet (full of fruits, vegetables, whole  grains, lean protein, water--limit salt, fat, and sugar intake) and increase physical activity as tolerated.  Let us know about insurance coverage.  I have personally reviewed and noted the following in the patient's chart:   . Medical and social history . Use of alcohol, tobacco or illicit drugs  . Current medications and supplements . Functional ability and status . Nutritional status . Physical activity . Advanced directives . List of other physicians . Hospitalizations, surgeries, and ER  visits in previous 12 months . Vitals . Screenings to include cognitive, depression, and falls . Referrals and appointments  In addition, I have reviewed and discussed with patient certain preventive protocols, quality metrics, and best practice recommendations. A written personalized care plan for preventive services as well as general preventive health recommendations were provided to patient.     Naaman Plummer [place], [place]  11/18/2018  Kathlene November, MD

## 2018-11-17 NOTE — Assessment & Plan Note (Signed)
Well controlled, no changes to meds. Encouraged heart healthy diet such as the DASH diet and exercise as tolerated.  °

## 2018-11-17 NOTE — Assessment & Plan Note (Signed)
Using Flucinonide drops prn with good results given refill. Consider H2O2 prn

## 2018-11-17 NOTE — Assessment & Plan Note (Signed)
Encouraged heart healthy diet, increase exercise, avoid trans fats, consider a krill oil cap daily 

## 2018-11-17 NOTE — Patient Instructions (Addendum)
Consider the Shingrix shot, 2 shots over 2-6 months at the pharmacy  Call insurance regarding coverage for Dexa scan for bone strength, what code can we use to order  Preventive Care 65 Years and Older, Female Preventive care refers to lifestyle choices and visits with your health care provider that can promote health and wellness. What does preventive care include?  A yearly physical exam. This is also called an annual well check.  Dental exams once or twice a year.  Routine eye exams. Ask your health care provider how often you should have your eyes checked.  Personal lifestyle choices, including: ? Daily care of your teeth and gums. ? Regular physical activity. ? Eating a healthy diet. ? Avoiding tobacco and drug use. ? Limiting alcohol use. ? Practicing safe sex. ? Taking low-dose aspirin every day. ? Taking vitamin and mineral supplements as recommended by your health care provider. What happens during an annual well check? The services and screenings done by your health care provider during your annual well check will depend on your age, overall health, lifestyle risk factors, and family history of disease. Counseling Your health care provider may ask you questions about your:  Alcohol use.  Tobacco use.  Drug use.  Emotional well-being.  Home and relationship well-being.  Sexual activity.  Eating habits.  History of falls.  Memory and ability to understand (cognition).  Work and work Statistician.  Reproductive health.  Screening You may have the following tests or measurements:  Height, weight, and BMI.  Blood pressure.  Lipid and cholesterol levels. These may be checked every 5 years, or more frequently if you are over 64 years old.  Skin check.  Lung cancer screening. You may have this screening every year starting at age 20 if you have a 30-pack-year history of smoking and currently smoke or have quit within the past 15 years.  Fecal occult  blood test (FOBT) of the stool. You may have this test every year starting at age 41.  Flexible sigmoidoscopy or colonoscopy. You may have a sigmoidoscopy every 5 years or a colonoscopy every 10 years starting at age 74.  Hepatitis C blood test.  Hepatitis B blood test.  Sexually transmitted disease (STD) testing.  Diabetes screening. This is done by checking your blood sugar (glucose) after you have not eaten for a while (fasting). You may have this done every 1-3 years.  Bone density scan. This is done to screen for osteoporosis. You may have this done starting at age 54.  Mammogram. This may be done every 1-2 years. Talk to your health care provider about how often you should have regular mammograms.  Talk with your health care provider about your test results, treatment options, and if necessary, the need for more tests. Vaccines Your health care provider may recommend certain vaccines, such as:  Influenza vaccine. This is recommended every year.  Tetanus, diphtheria, and acellular pertussis (Tdap, Td) vaccine. You may need a Td booster every 10 years.  Varicella vaccine. You may need this if you have not been vaccinated.  Zoster vaccine. You may need this after age 53.  Measles, mumps, and rubella (MMR) vaccine. You may need at least one dose of MMR if you were born in 1957 or later. You may also need a second dose.  Pneumococcal 13-valent conjugate (PCV13) vaccine. One dose is recommended after age 53.  Pneumococcal polysaccharide (PPSV23) vaccine. One dose is recommended after age 51.  Meningococcal vaccine. You may need this if you  have certain conditions.  Hepatitis A vaccine. You may need this if you have certain conditions or if you travel or work in places where you may be exposed to hepatitis A.  Hepatitis B vaccine. You may need this if you have certain conditions or if you travel or work in places where you may be exposed to hepatitis B.  Haemophilus influenzae  type b (Hib) vaccine. You may need this if you have certain conditions.  Talk to your health care provider about which screenings and vaccines you need and how often you need them. This information is not intended to replace advice given to you by your health care provider. Make sure you discuss any questions you have with your health care provider. Document Released: 12/23/2015 Document Revised: 08/15/2016 Document Reviewed: 09/27/2015 Elsevier Interactive Patient Education  [employer].

## 2018-11-17 NOTE — Progress Notes (Signed)
Subjective:    Patient ID: Taylor Newman, adult    DOB: 09-12-1951, 67 y.o.   MRN: [id number]  No chief complaint on file.   HPI Patient is in today for annual physical exam and follow up on hyperglycemia, hypertension and hyperlipidemia. Is doing well, now identifies as a female and is using meds.presently but is considering an orchiectomy next year. Will check with insurance regarding for bone density and mammogram testing. Is staying active but does not always eat a heart healthy diet. Works full time for [employer]. Manages activities of daily living. Denies CP/palp/SOB/HA/congestion/fevers/GI or GU c/o. Taking meds as prescribed  Past Medical History:  Diagnosis Date  . Chest pain 01/12/2013  . Dyspnea 05/20/2017  . FHx: BRCA2 gene positive 02/20/2015   Sister with this and breast cancer.  . Grief reaction 07/15/2013  . Heart murmur   . History of chicken pox   . History of colon polyps   . History of tobacco abuse 01/13/2014   Last cigarettes 05/30/2016  . Hyperkalemia 02/11/2017  . Hyperlipidemia   . Hypertension   . Laceration of hand 10/24/2014  . Female-to-female transgender person 10/30/2016  . Female-to-female transgender person 10/30/2016   Sees endocrinology Dr Linward Natal at [facility] in [place]  . Preventative health care 08/13/2014  . Sun-damaged skin 01/13/2014  . Tobacco use disorder 01/13/2014    Past Surgical History:  Procedure Laterality Date  . COLONOSCOPY    . POLYPECTOMY    . TONSILLECTOMY  1954    Family History  Problem Relation Age of Onset  . Colon cancer Father 72  . Hyperlipidemia Father   . Heart disease Father        stent placement  . Hypertension Father   . Cancer Father 14       colon with liver mets  . Diabetes Father   . Breast cancer Sister        BRCA2 positive  . Cancer Sister        twice, breast cancer, BRCA 2 gene mutation  . Hyperlipidemia Mother   . Alzheimer's disease Mother   . Hypertension Mother   .  Diabetes Maternal Grandmother        father  . Cancer Son 1  . Colon cancer Son        Joslyn Hy - not biologically related  . Mental illness Son        bipolar  . Allergies Sister   . Breast cancer Paternal Aunt        2 paternal aunts with breast cancer postmenopausal  . Breast cancer Cousin        3 pat female first cousins with breast cancer  . Breast cancer Other        niece with breast cancer  . Prostate cancer Neg Hx   . Esophageal cancer Neg Hx   . Rectal cancer Neg Hx   . Stomach cancer Neg Hx     Social History   Socioeconomic History  . Marital status: Married    Spouse name: Not on file  . Number of children: Not on file  . Years of education: Not on file  . Highest education level: Not on file  Occupational History  . Not on file  Social Needs  . Financial resource strain: Not on file  . Food insecurity:    Worry: Not on file    Inability: Not on file  . Transportation needs:    Medical: Not  on file    Non-medical: Not on file  Tobacco Use  . Smoking status: Former Smoker    Packs/day: 1.00    Years: 45.00    Pack years: 45.00    Types: Cigarettes    Last attempt to quit: 05/14/2016    Years since quitting: 2.5  . Smokeless tobacco: Never Used  . Tobacco comment: 1/2  ppd  Substance and Sexual Activity  . Alcohol use: Yes    Alcohol/week: 18.0 standard drinks    Types: 18 Cans of beer per week  . Drug use: No  . Sexual activity: Not on file  Lifestyle  . Physical activity:    Days per week: Not on file    Minutes per session: Not on file  . Stress: Not on file  Relationships  . Social connections:    Talks on phone: Not on file    Gets together: Not on file    Attends religious service: Not on file    Active member of club or organization: Not on file    Attends meetings of clubs or organizations: Not on file    Relationship status: Not on file  . Intimate partner violence:    Fear of current or ex partner: Not on file    Emotionally  abused: Not on file    Physically abused: Not on file    Forced sexual activity: Not on file  Other Topics Concern  . Not on file  Social History Narrative  . Not on file    Outpatient Medications Prior to Visit  Medication Sig Dispense Refill  . amLODipine (NORVASC) 5 MG tablet Take 1 tablet (5 mg total) by mouth 2 (two) times daily. 180 tablet 1  . aspirin 81 MG chewable tablet Chew 81 mg by mouth daily.    Marland Kitchen atorvastatin (LIPITOR) 20 MG tablet TAKE 1 TABLET BY MOUTH DAILY. 90 tablet 1  . Biotin 5000 MCG TABS Take 1 tablet by mouth daily.    . cephALEXin (KEFLEX) 500 MG capsule Take 1 capsule (500 mg total) by mouth 3 (three) times daily. 30 capsule 0  . estradiol (ESTRACE) 2 MG tablet Take 1 tablet (2 mg total) by mouth daily. 90 tablet prn  . KRILL OIL PO Take 1 capsule by mouth daily.    Marland Kitchen lactobacillus acidophilus (BACID) TABS tablet Take 1 tablet by mouth daily.    Marland Kitchen MELATONIN PO Take by mouth at bedtime as needed.    . Multiple Vitamin (MULTIVITAMIN) tablet Take 1 tablet by mouth daily.    . mupirocin ointment (BACTROBAN) 2 % Apply to thumb 3 times daily for 5 days 22 g 0  . psyllium (REGULOID) 0.52 G capsule Take 0.52 g by mouth daily.    Marland Kitchen spironolactone (ALDACTONE) 50 MG tablet Take 1 tablet (50 mg total) by mouth daily. 90 tablet prn   No facility-administered medications prior to visit.     Allergies  Allergen Reactions  . Lisinopril Cough    hoarseness    Review of Systems  Constitutional: Negative for chills, fever and malaise/fatigue.  HENT: Negative for congestion and hearing loss.   Eyes: Negative for discharge.  Respiratory: Negative for cough, sputum production and shortness of breath.   Cardiovascular: Negative for chest pain, palpitations and leg swelling.  Gastrointestinal: Negative for abdominal pain, blood in stool, constipation, diarrhea, heartburn, nausea and vomiting.  Genitourinary: Negative for dysuria, frequency, hematuria and urgency.    Musculoskeletal: Negative for back pain, falls and myalgias.  Skin: Negative for rash.  Neurological: Negative for dizziness, sensory change, loss of consciousness, weakness and headaches.  Endo/Heme/Allergies: Negative for environmental allergies. Does not bruise/bleed easily.  Psychiatric/Behavioral: Negative for depression and suicidal ideas. The patient is not nervous/anxious and does not have insomnia.        Objective:    Physical Exam  Constitutional: She is oriented to person, place, and time. She appears well-developed and well-nourished. No distress.  HENT:  Head: Normocephalic and atraumatic.  Eyes: Conjunctivae are normal.  Neck: Neck supple. No thyromegaly present.  Cardiovascular: Normal rate, regular rhythm and normal heart sounds.  No murmur heard. Pulmonary/Chest: Effort normal and breath sounds normal. No respiratory distress.  Abdominal: Soft. Bowel sounds are normal. She exhibits no distension and no mass. There is no tenderness.  Genitourinary:  Genitourinary Comments: Breast exam unremarkable, no lesions or masses  Musculoskeletal: She exhibits no edema.  Lymphadenopathy:    She has no cervical adenopathy.  Neurological: She is alert and oriented to person, place, and time.  Skin: Skin is warm and dry.  Psychiatric: She has a normal mood and affect. Her behavior is normal.    BP 120/82 (BP Location: Left Arm, Patient Position: Sitting, Cuff Size: Normal)   Pulse 65   Temp 97.8 F (36.6 C) (Oral)   Resp 18   Ht 5' 8.5" (1.74 m)   Wt 209 lb 3.2 oz (94.9 kg)   SpO2 98%   BMI 31.35 kg/m  Wt Readings from Last 3 Encounters:  11/17/18 209 lb 3.2 oz (94.9 kg)  06/17/18 200 lb (90.7 kg)  03/25/18 205 lb 9.6 oz (93.3 kg)     Lab Results  Component Value Date   WBC 8.8 03/25/2018   HGB 14.7 03/25/2018   HCT 42.4 03/25/2018   PLT 303.0 03/25/2018   GLUCOSE 113 (H) 03/25/2018   CHOL 190 03/25/2018   TRIG 161.0 (H) 03/25/2018   HDL 57.90 03/25/2018    LDLCALC 100 (H) 03/25/2018   ALT 27 03/25/2018   AST 18 03/25/2018   NA 130 (L) 03/25/2018   K 4.7 03/25/2018   CL 100 03/25/2018   CREATININE 0.88 03/25/2018   BUN 18 03/25/2018   CO2 25 03/25/2018   TSH 3.71 03/25/2018   PSA 0.39 10/30/2016   HGBA1C 6.2 03/25/2018   MICROALBUR <0.7 08/19/2015    Lab Results  Component Value Date   TSH 3.71 03/25/2018   Lab Results  Component Value Date   WBC 8.8 03/25/2018   HGB 14.7 03/25/2018   HCT 42.4 03/25/2018   MCV 95.3 03/25/2018   PLT 303.0 03/25/2018   Lab Results  Component Value Date   NA 130 (L) 03/25/2018   K 4.7 03/25/2018   CO2 25 03/25/2018   GLUCOSE 113 (H) 03/25/2018   BUN 18 03/25/2018   CREATININE 0.88 03/25/2018   BILITOT 0.4 03/25/2018   ALKPHOS 61 03/25/2018   AST 18 03/25/2018   ALT 27 03/25/2018   PROT 6.8 03/25/2018   ALBUMIN 4.1 03/25/2018   CALCIUM 9.8 03/25/2018   GFR 91.43 03/25/2018   Lab Results  Component Value Date   CHOL 190 03/25/2018   Lab Results  Component Value Date   HDL 57.90 03/25/2018   Lab Results  Component Value Date   LDLCALC 100 (H) 03/25/2018   Lab Results  Component Value Date   TRIG 161.0 (H) 03/25/2018   Lab Results  Component Value Date   CHOLHDL 3 03/25/2018   Lab Results  Component Value Date   HGBA1C 6.2 03/25/2018       Assessment & Plan:   Problem List Items Addressed This Visit    Hyperlipidemia    Encouraged heart healthy diet, increase exercise, avoid trans fats, consider a krill oil cap daily      Relevant Orders   Lipid panel   HTN (hypertension)    Well controlled, no changes to meds. Encouraged heart healthy diet such as the DASH diet and exercise as tolerated.       Relevant Orders   TSH   Comprehensive metabolic panel   CBC   Hyperglycemia    hgba1c acceptable, minimize simple carbs. Increase exercise as tolerated.       Relevant Orders   Hemoglobin A1c   Otitis externa, acute eczematoid    Using Flucinonide drops prn  with good results given refill. Consider H2O2 prn      Increased urinary frequency    Check PSA      Relevant Orders   PSA      I am having Braxton Feathers "Ayonna" maintain her aspirin, KRILL OIL PO, lactobacillus acidophilus, multivitamin, psyllium, MELATONIN PO, Biotin, atorvastatin, spironolactone, estradiol, amLODipine, cephALEXin, mupirocin ointment, and Fluocinolone Acetonide.  Meds ordered this encounter  Medications  . Fluocinolone Acetonide 0.01 % OIL    Sig: Place 5 drops in ear(s) 2 (two) times daily.    Dispense:  20 mL    Refill:  1     Penni Homans, MD

## 2018-11-18 ENCOUNTER — Ambulatory Visit (INDEPENDENT_AMBULATORY_CARE_PROVIDER_SITE_OTHER): Payer: PPO | Admitting: *Deleted

## 2018-11-18 ENCOUNTER — Encounter: Payer: Self-pay | Admitting: *Deleted

## 2018-11-18 ENCOUNTER — Telehealth: Payer: Self-pay | Admitting: *Deleted

## 2018-11-18 VITALS — BP 138/86 | HR 78 | Ht 68.0 in | Wt 209.4 lb

## 2018-11-18 DIAGNOSIS — Z0000 Encounter for general adult medical examination without abnormal findings: Secondary | ICD-10-CM

## 2018-11-18 NOTE — Telephone Encounter (Signed)
-----   Message from Mosie Lukes, MD sent at 11/17/2018  9:36 PM EST ----- Labs look good  Except potassium is up. Avoid potassium in diet, increase hydration recheck CMP on 12/20 at next visit tomorrow

## 2018-11-18 NOTE — Addendum Note (Signed)
Addended by: Magdalene Molly A on: 11/18/2018 06:19 PM   Modules accepted: Orders

## 2018-11-18 NOTE — Telephone Encounter (Signed)
Pt made aware and agrees to come have labs rechecked on Thursday or Friday of this week.

## 2018-11-18 NOTE — Patient Instructions (Signed)
Please schedule your next medicare wellness visit with me in 1 yr.  Continue to eat heart healthy diet (full of fruits, vegetables, whole grains, lean protein, water--limit salt, fat, and sugar intake) and increase physical activity as tolerated.  Let us know about insurance coverage.   Taylor Newman , Thank you for taking time to come for your Medicare Wellness Visit. I appreciate your ongoing commitment to your health goals. Please review the following plan we discussed and let me know if I can assist you in the future.   These are the goals we discussed: Goals    . Increase physical activity    . Reduce alcohol intake       This is a list of the screening recommended for you and due dates:  Health Maintenance  Topic Date Due  . DEXA scan (bone density measurement)  11/11/2014  . Mammogram  05/11/2019*  . Colon Cancer Screening  04/30/2020  . Tetanus Vaccine  05/11/2023  . Flu Shot  Completed  .  Hepatitis C: One time screening is recommended by Center for Disease Control  (CDC) for  adults born from [birth year] through 1965.   Completed  . Pneumonia vaccines  Completed  *Topic was postponed. The date shown is not the original due date.   Health Maintenance, Female Adopting a healthy lifestyle and getting preventive care can go a long way to promote health and wellness. Talk with your health care provider about what schedule of regular examinations is right for you. This is a good chance for you to check in with your provider about disease prevention and staying healthy. In between checkups, there are plenty of things you can do on your own. Experts have done a lot of research about which lifestyle changes and preventive measures are most likely to keep you healthy. Ask your health care provider for more information. Weight and diet Eat a healthy diet  Be sure to include plenty of vegetables, fruits, low-fat dairy products, and lean protein.  Do not eat a lot of foods high in solid  fats, added sugars, or salt.  Get regular exercise. This is one of the most important things you can do for your health. ? Most adults should exercise for at least 150 minutes each week. The exercise should increase your heart rate and make you sweat (moderate-intensity exercise). ? Most adults should also do strengthening exercises at least twice a week. This is in addition to the moderate-intensity exercise.  Maintain a healthy weight  Body mass index (BMI) is a measurement that can be used to identify possible weight problems. It estimates body fat based on height and weight. Your health care provider can help determine your BMI and help you achieve or maintain a healthy weight.  For females 69 years of age and older: ? A BMI below 18.5 is considered underweight. ? A BMI of 18.5 to 24.9 is normal. ? A BMI of 25 to 29.9 is considered overweight. ? A BMI of 30 and above is considered obese.  Watch levels of cholesterol and blood lipids  You should start having your blood tested for lipids and cholesterol at 69 years of age, then have this test every 5 years.  You may need to have your cholesterol levels checked more often if: ? Your lipid or cholesterol levels are high. ? You are older than 69 years of age. ? You are at high risk for heart disease.  Cancer screening Lung Cancer  Lung cancer screening is  recommended for adults 55-82 years old who are at high risk for lung cancer because of a history of smoking.  A yearly low-dose CT scan of the lungs is recommended for people who: ? Currently smoke. ? Have quit within the past 15 years. ? Have at least a 30-pack-year history of smoking. A pack year is smoking an average of one pack of cigarettes a day for 1 year.  Yearly screening should continue until it has been 15 years since you quit.  Yearly screening should stop if you develop a health problem that would prevent you from having lung cancer treatment.  Breast  Cancer  Practice breast self-awareness. This means understanding how your breasts normally appear and feel.  It also means doing regular breast self-exams. Let your health care provider know about any changes, no matter how small.  If you are in your 20s or 30s, you should have a clinical breast exam (CBE) by a health care provider every 1-3 years as part of a regular health exam.  If you are 71 or older, have a CBE every year. Also consider having a breast X-ray (mammogram) every year.  If you have a family history of breast cancer, talk to your health care provider about genetic screening.  If you are at high risk for breast cancer, talk to your health care provider about having an MRI and a mammogram every year.  Breast cancer gene (BRCA) assessment is recommended for women who have family members with BRCA-related cancers. BRCA-related cancers include: ? Breast. ? Ovarian. ? Tubal. ? Peritoneal cancers.  Results of the assessment will determine the need for genetic counseling and BRCA1 and BRCA2 testing.  Cervical Cancer Your health care provider may recommend that you be screened regularly for cancer of the pelvic organs (ovaries, uterus, and vagina). This screening involves a pelvic examination, including checking for microscopic changes to the surface of your cervix (Pap test). You may be encouraged to have this screening done every 3 years, beginning at age 75.  For women ages 4-65, health care providers may recommend pelvic exams and Pap testing every 3 years, or they may recommend the Pap and pelvic exam, combined with testing for human papilloma virus (HPV), every 5 years. Some types of HPV increase your risk of cervical cancer. Testing for HPV may also be done on women of any age with unclear Pap test results.  Other health care providers may not recommend any screening for nonpregnant women who are considered low risk for pelvic cancer and who do not have symptoms. Ask your  health care provider if a screening pelvic exam is right for you.  If you have had past treatment for cervical cancer or a condition that could lead to cancer, you need Pap tests and screening for cancer for at least 20 years after your treatment. If Pap tests have been discontinued, your risk factors (such as having a new sexual partner) need to be reassessed to determine if screening should resume. Some women have medical problems that increase the chance of getting cervical cancer. In these cases, your health care provider may recommend more frequent screening and Pap tests.  Colorectal Cancer  This type of cancer can be detected and often prevented.  Routine colorectal cancer screening usually begins at 69 years of age and continues through 69 years of age.  Your health care provider may recommend screening at an earlier age if you have risk factors for colon cancer.  Your health care provider may  also recommend using home test kits to check for hidden blood in the stool.  A small camera at the end of a tube can be used to examine your colon directly (sigmoidoscopy or colonoscopy). This is done to check for the earliest forms of colorectal cancer.  Routine screening usually begins at age 23.  Direct examination of the colon should be repeated every 5-10 years through 69 years of age. However, you may need to be screened more often if early forms of precancerous polyps or small growths are found.  Skin Cancer  Check your skin from head to toe regularly.  Tell your health care provider about any new moles or changes in moles, especially if there is a change in a mole's shape or color.  Also tell your health care provider if you have a mole that is larger than the size of a pencil eraser.  Always use sunscreen. Apply sunscreen liberally and repeatedly throughout the day.  Protect yourself by wearing long sleeves, pants, a wide-brimmed hat, and sunglasses whenever you are  outside.  Heart disease, diabetes, and high blood pressure  High blood pressure causes heart disease and increases the risk of stroke. High blood pressure is more likely to develop in: ? People who have blood pressure in the high end of the normal range (130-139/85-89 mm Hg). ? People who are overweight or obese. ? People who are African American.  If you are 65-55 years of age, have your blood pressure checked every 3-5 years. If you are 20 years of age or older, have your blood pressure checked every year. You should have your blood pressure measured twice-once when you are at a hospital or clinic, and once when you are not at a hospital or clinic. Record the average of the two measurements. To check your blood pressure when you are not at a hospital or clinic, you can use: ? An automated blood pressure machine at a pharmacy. ? A home blood pressure monitor.  If you are between 70 years and 65 years old, ask your health care provider if you should take aspirin to prevent strokes.  Have regular diabetes screenings. This involves taking a blood sample to check your fasting blood sugar level. ? If you are at a normal weight and have a low risk for diabetes, have this test once every three years after 69 years of age. ? If you are overweight and have a high risk for diabetes, consider being tested at a younger age or more often. Preventing infection Hepatitis B  If you have a higher risk for hepatitis B, you should be screened for this virus. You are considered at high risk for hepatitis B if: ? You were born in a country where hepatitis B is common. Ask your health care provider which countries are considered high risk. ? Your parents were born in a high-risk country, and you have not been immunized against hepatitis B (hepatitis B vaccine). ? You have HIV or AIDS. ? You use needles to inject street drugs. ? You live with someone who has hepatitis B. ? You have had sex with someone who has  hepatitis B. ? You get hemodialysis treatment. ? You take certain medicines for conditions, including cancer, organ transplantation, and autoimmune conditions.  Hepatitis C  Blood testing is recommended for: ? Everyone born from [birth year] through 1965. ? Anyone with known risk factors for hepatitis C.  Sexually transmitted infections (STIs)  You should be screened for sexually transmitted infections (STIs)  including gonorrhea and chlamydia if: ? You are sexually active and are younger than 69 years of age. ? You are older than 69 years of age and your health care provider tells you that you are at risk for this type of infection. ? Your sexual activity has changed since you were last screened and you are at an increased risk for chlamydia or gonorrhea. Ask your health care provider if you are at risk.  If you do not have HIV, but are at risk, it may be recommended that you take a prescription medicine daily to prevent HIV infection. This is called pre-exposure prophylaxis (PrEP). You are considered at risk if: ? You are sexually active and do not regularly use condoms or know the HIV status of your partner(s). ? You take drugs by injection. ? You are sexually active with a partner who has HIV.  Talk with your health care provider about whether you are at high risk of being infected with HIV. If you choose to begin PrEP, you should first be tested for HIV. You should then be tested every 3 months for as long as you are taking PrEP. Pregnancy  If you are premenopausal and you may become pregnant, ask your health care provider about preconception counseling.  If you may become pregnant, take 400 to 800 micrograms (mcg) of folic acid every day.  If you want to prevent pregnancy, talk to your health care provider about birth control (contraception). Osteoporosis and menopause  Osteoporosis is a disease in which the bones lose minerals and strength with aging. This can result in serious bone  fractures. Your risk for osteoporosis can be identified using a bone density scan.  If you are 65 years of age or older, or if you are at risk for osteoporosis and fractures, ask your health care provider if you should be screened.  Ask your health care provider whether you should take a calcium or vitamin D supplement to lower your risk for osteoporosis.  Menopause may have certain physical symptoms and risks.  Hormone replacement therapy may reduce some of these symptoms and risks. Talk to your health care provider about whether hormone replacement therapy is right for you. Follow these instructions at home:  Schedule regular health, dental, and eye exams.  Stay current with your immunizations.  Do not use any tobacco products including cigarettes, chewing tobacco, or electronic cigarettes.  If you are pregnant, do not drink alcohol.  If you are breastfeeding, limit how much and how often you drink alcohol.  Limit alcohol intake to no more than 1 drink per day for nonpregnant women. One drink equals 12 ounces of beer, 5 ounces of wine, or 1 ounces of hard liquor.  Do not use street drugs.  Do not share needles.  Ask your health care provider for help if you need support or information about quitting drugs.  Tell your health care provider if you often feel depressed.  Tell your health care provider if you have ever been abused or do not feel safe at home. This information is not intended to replace advice given to you by your health care provider. Make sure you discuss any questions you have with your health care provider. Document Released: 06/11/2011 Document Revised: 05/03/2016 Document Reviewed: 08/30/2015 Elsevier Interactive Patient Education  [employer].

## 2018-12-11 ENCOUNTER — Other Ambulatory Visit: Payer: Self-pay | Admitting: Family Medicine

## 2018-12-11 DIAGNOSIS — E785 Hyperlipidemia, unspecified: Secondary | ICD-10-CM

## 2019-02-04 ENCOUNTER — Ambulatory Visit: Payer: PPO | Admitting: Endocrinology

## 2019-02-04 ENCOUNTER — Encounter: Payer: Self-pay | Admitting: Endocrinology

## 2019-02-04 VITALS — BP 130/82 | HR 89 | Ht 68.0 in | Wt 209.0 lb

## 2019-02-04 DIAGNOSIS — F640 Transsexualism: Secondary | ICD-10-CM

## 2019-02-04 DIAGNOSIS — Z789 Other specified health status: Secondary | ICD-10-CM

## 2019-02-04 DIAGNOSIS — E875 Hyperkalemia: Secondary | ICD-10-CM

## 2019-02-04 LAB — BASIC METABOLIC PANEL
BUN: 11 mg/dL (ref 6–23)
CO2: 28 mEq/L (ref 19–32)
Calcium: 9.8 mg/dL (ref 8.4–10.5)
Chloride: 100 mEq/L (ref 96–112)
Creatinine, Ser: 0.9 mg/dL (ref 0.40–1.50)
GFR: 83.61 mL/min (ref >60.00)
Glucose, Bld: 115 mg/dL — ABNORMAL HIGH (ref 70–99)
Potassium: 4.4 mEq/L (ref 3.5–5.1)
Sodium: 135 mEq/L (ref 135–145)

## 2019-02-04 NOTE — Progress Notes (Signed)
Subjective:    Patient ID: Taylor Newman, adult    DOB: 1951/02/02, 68 y.o.   MRN: [id number]  HPI  Pt returns for f/u of transgender state ((M to F); she has never had surgery, but she goes to counseling on a ongoing basis; she is physically but not legally separated from her wife; she has 1 biological child, and 2 stepchildren; he has been on estradiol and aldactone since 2017; she quit smoking in 2017; aldactone dosage has been limited by hyperkalemia).  She has reduced K+ intake.  pt states she feels well in general.  She has slight breast growth.   Past Medical History:  Diagnosis Date  . Chest pain 01/12/2013  . Dyspnea 05/20/2017  . FHx: BRCA2 gene positive 02/20/2015   Sister with this and breast cancer.  . Grief reaction 07/15/2013  . Heart murmur   . History of chicken pox   . History of colon polyps   . History of tobacco abuse 01/13/2014   Last cigarettes 05/30/2016  . Hyperkalemia 02/11/2017  . Hyperlipidemia   . Hypertension   . Laceration of hand 10/24/2014  . Female-to-female transgender person 10/30/2016  . Female-to-female transgender person 10/30/2016   Sees endocrinology Dr Linward Natal at [facility] in [place]  . Preventative health care 08/13/2014  . Sun-damaged skin 01/13/2014  . Tobacco use disorder 01/13/2014    Past Surgical History:  Procedure Laterality Date  . COLONOSCOPY    . POLYPECTOMY    . TONSILLECTOMY  1954    Social History   Socioeconomic History  . Marital status: Married    Spouse name: Not on file  . Number of children: Not on file  . Years of education: Not on file  . Highest education level: Not on file  Occupational History  . Not on file  Social Needs  . Financial resource strain: Not on file  . Food insecurity:    Worry: Not on file    Inability: Not on file  . Transportation needs:    Medical: Not on file    Non-medical: Not on file  Tobacco Use  . Smoking status: Former Smoker    Packs/day: 1.00    Years:  45.00    Pack years: 45.00    Types: Cigarettes    Last attempt to quit: 05/14/2016    Years since quitting: 2.7  . Smokeless tobacco: Never Used  . Tobacco comment: 1/2  ppd  Substance and Sexual Activity  . Alcohol use: Yes    Comment: Drinks weekly  . Drug use: No  . Sexual activity: Not on file  Lifestyle  . Physical activity:    Days per week: Not on file    Minutes per session: Not on file  . Stress: Not on file  Relationships  . Social connections:    Talks on phone: Not on file    Gets together: Not on file    Attends religious service: Not on file    Active member of club or organization: Not on file    Attends meetings of clubs or organizations: Not on file    Relationship status: Not on file  . Intimate partner violence:    Fear of current or ex partner: Not on file    Emotionally abused: Not on file    Physically abused: Not on file    Forced sexual activity: Not on file  Other Topics Concern  . Not on file  Social History Narrative  .  Not on file    Current Outpatient Medications on File Prior to Visit  Medication Sig Dispense Refill  . aspirin 81 MG chewable tablet Chew 81 mg by mouth daily.    Marland Kitchen atorvastatin (LIPITOR) 20 MG tablet TAKE 1 TABLET BY MOUTH DAILY. 90 tablet 1  . Biotin 5000 MCG TABS Take 1 tablet by mouth daily.    Marland Kitchen estradiol (ESTRACE) 2 MG tablet Take 1 tablet (2 mg total) by mouth daily. 90 tablet prn  . Fluocinolone Acetonide 0.01 % OIL Place 5 drops in ear(s) 2 (two) times daily. 20 mL 1  . KRILL OIL PO Take 1 capsule by mouth daily.    Marland Kitchen lactobacillus acidophilus (BACID) TABS tablet Take 1 tablet by mouth daily.    Marland Kitchen MELATONIN PO Take by mouth at bedtime as needed.    . Multiple Vitamin (MULTIVITAMIN) tablet Take 1 tablet by mouth daily.    . mupirocin ointment (BACTROBAN) 2 % Apply to thumb 3 times daily for 5 days 22 g 0  . psyllium (REGULOID) 0.52 G capsule Take 0.52 g by mouth daily.    Marland Kitchen spironolactone (ALDACTONE) 50 MG tablet Take  1 tablet (50 mg total) by mouth daily. 90 tablet prn  . amLODipine (NORVASC) 5 MG tablet Take 1 tablet (5 mg total) by mouth 2 (two) times daily. 180 tablet 1   No current facility-administered medications on file prior to visit.     Allergies  Allergen Reactions  . Lisinopril Cough    hoarseness    Family History  Problem Relation Age of Onset  . Colon cancer Father 24  . Hyperlipidemia Father   . Heart disease Father        stent placement  . Hypertension Father   . Cancer Father 75       colon with liver mets  . Diabetes Father   . Breast cancer Sister        BRCA2 positive  . Cancer Sister        twice, breast cancer, BRCA 2 gene mutation  . Hyperlipidemia Mother   . Alzheimer's disease Mother   . Hypertension Mother   . Diabetes Maternal Grandmother        father  . Cancer Son 19  . Colon cancer Son        Joslyn Hy - not biologically related  . Mental illness Son        bipolar  . Allergies Sister   . Breast cancer Paternal Aunt        2 paternal aunts with breast cancer postmenopausal  . Breast cancer Cousin        3 pat female first cousins with breast cancer  . Breast cancer Other        niece with breast cancer  . Prostate cancer Neg Hx   . Esophageal cancer Neg Hx   . Rectal cancer Neg Hx   . Stomach cancer Neg Hx     BP 130/82 (BP Location: Right Arm, Patient Position: Sitting, Cuff Size: Large)   Pulse 89   Ht '5\' 8"'$  (1.727 m)   Wt 209 lb (94.8 kg)   SpO2 91%   BMI 31.78 kg/m    Review of Systems Denies muscle weakness.      Objective:   Physical Exam VITAL SIGNS:  See vs page GENERAL: no distress. Ext: no leg edema.    Lab Results  Component Value Date   TSH 3.76 11/17/2018   Lab Results  Component  Value Date   CREATININE 0.85 11/17/2018   BUN 16 11/17/2018   NA 138 11/17/2018   K 5.6 (H) 11/17/2018   CL 103 11/17/2018   CO2 28 11/17/2018       Assessment & Plan:  Transgender state: due for labs today. Hyperkalemia:  recheck today  Patient Instructions  Blood tests are requested for you today.  We'll let you know about the results.   If we can, we'll increase the estradiol pill.   Please come back for a follow-up appointment in 1 year.

## 2019-02-04 NOTE — Patient Instructions (Addendum)
Blood tests are requested for you today.  We'll let you know about the results.   If we can, we'll increase the estradiol pill.   Please come back for a follow-up appointment in 1 year.

## 2019-02-07 LAB — TESTOSTERONE,FREE AND TOTAL
Testosterone, Free: 1 pg/mL — ABNORMAL LOW (ref 6.6–18.1)
Testosterone: <3 ng/dL — ABNORMAL LOW (ref 264–916)

## 2019-02-14 LAB — ESTRADIOL, FREE
Estradiol, Free: 5.6 pg/mL — ABNORMAL HIGH
Estradiol: 326 pg/mL — ABNORMAL HIGH

## 2019-02-26 ENCOUNTER — Other Ambulatory Visit: Payer: Self-pay | Admitting: Family Medicine

## 2019-06-01 ENCOUNTER — Ambulatory Visit (INDEPENDENT_AMBULATORY_CARE_PROVIDER_SITE_OTHER): Payer: PPO | Admitting: Family Medicine

## 2019-06-01 ENCOUNTER — Encounter: Payer: Self-pay | Admitting: Family Medicine

## 2019-06-01 ENCOUNTER — Other Ambulatory Visit: Payer: Self-pay

## 2019-06-01 VITALS — BP 134/82 | HR 73 | Temp 98.2°F | Resp 18 | Wt 209.8 lb

## 2019-06-01 DIAGNOSIS — I10 Essential (primary) hypertension: Secondary | ICD-10-CM | POA: Diagnosis not present

## 2019-06-01 DIAGNOSIS — E785 Hyperlipidemia, unspecified: Secondary | ICD-10-CM

## 2019-06-01 DIAGNOSIS — Z789 Other specified health status: Secondary | ICD-10-CM

## 2019-06-01 DIAGNOSIS — R739 Hyperglycemia, unspecified: Secondary | ICD-10-CM

## 2019-06-01 DIAGNOSIS — F640 Transsexualism: Secondary | ICD-10-CM | POA: Diagnosis not present

## 2019-06-01 LAB — CBC
HCT: 43.2 % (ref 39.0–52.0)
Hemoglobin: 15 g/dL (ref 13.0–17.0)
MCHC: 34.6 g/dL (ref 30.0–36.0)
MCV: 95.7 fl (ref 78.0–100.0)
Platelets: 297 10*3/uL (ref 150.0–400.0)
RBC: 4.52 Mil/uL (ref 4.22–5.81)
RDW: 13.1 % (ref 11.5–15.5)
WBC: 9.5 10*3/uL (ref 4.0–10.5)

## 2019-06-01 LAB — LIPID PANEL
Cholesterol: 189 mg/dL (ref 0–200)
HDL: 59.1 mg/dL (ref >39.00)
LDL Cholesterol: 99 mg/dL (ref 0–99)
NonHDL: 129.8
Total CHOL/HDL Ratio: 3
Triglycerides: 154 mg/dL — ABNORMAL HIGH (ref 0.0–149.0)
VLDL: 30.8 mg/dL (ref 0.0–40.0)

## 2019-06-01 LAB — HEMOGLOBIN A1C: Hgb A1c MFr Bld: 6.6 % — ABNORMAL HIGH (ref 4.6–6.5)

## 2019-06-01 LAB — COMPREHENSIVE METABOLIC PANEL
ALT: 44 U/L (ref 0–53)
AST: 28 U/L (ref 0–37)
Albumin: 4.4 g/dL (ref 3.5–5.2)
Alkaline Phosphatase: 72 U/L (ref 39–117)
BUN: 11 mg/dL (ref 6–23)
CO2: 26 mEq/L (ref 19–32)
Calcium: 9.5 mg/dL (ref 8.4–10.5)
Chloride: 101 mEq/L (ref 96–112)
Creatinine, Ser: 0.84 mg/dL (ref 0.40–1.50)
GFR: 90.45 mL/min (ref >60.00)
Glucose, Bld: 119 mg/dL — ABNORMAL HIGH (ref 70–99)
Potassium: 4.5 mEq/L (ref 3.5–5.1)
Sodium: 134 mEq/L — ABNORMAL LOW (ref 135–145)
Total Bilirubin: 0.6 mg/dL (ref 0.2–1.2)
Total Protein: 6.4 g/dL (ref 6.0–8.3)

## 2019-06-01 LAB — TSH: TSH: 2.69 u[IU]/mL (ref 0.35–4.50)

## 2019-06-01 MED ORDER — ATORVASTATIN CALCIUM 20 MG PO TABS: 20.0000 mg | ORAL_TABLET | Freq: Every day | ORAL | 1 refills | Status: DC

## 2019-06-01 NOTE — Assessment & Plan Note (Signed)
Well controlled, no changes to meds. Encouraged heart healthy diet such as the DASH diet and exercise as tolerated.  °

## 2019-06-01 NOTE — Patient Instructions (Signed)

## 2019-06-01 NOTE — Assessment & Plan Note (Signed)
Following with endocrinology and doing well

## 2019-06-01 NOTE — Assessment & Plan Note (Signed)
Encouraged heart healthy diet, increase exercise, avoid trans fats, consider a krill oil cap daily 

## 2019-06-01 NOTE — Assessment & Plan Note (Signed)
hgba1c acceptable, minimize simple carbs. Increase exercise as tolerated.  

## 2019-06-01 NOTE — Progress Notes (Signed)
Subjective:    Patient ID: Taylor Newman, adult    DOB: 1951/07/25, 68 y.o.   MRN: [id number]  No chief complaint on file.   HPI Patient is in today for follow up on hypertension, hyperlipidemia, and hyperglycemia. No recent febrile illness or hospitalizations. Is doing well. Denies CP/palp/SOB/HA/congestion/fevers/GI or GU c/o. Taking meds as prescribed  Past Medical History:  Diagnosis Date  . Chest pain 01/12/2013  . Dyspnea 05/20/2017  . FHx: BRCA2 gene positive 02/20/2015   Sister with this and breast cancer.  . Grief reaction 07/15/2013  . Heart murmur   . History of chicken pox   . History of colon polyps   . History of tobacco abuse 01/13/2014   Last cigarettes 05/30/2016  . Hyperkalemia 02/11/2017  . Hyperlipidemia   . Hypertension   . Laceration of hand 10/24/2014  . Female-to-female transgender person 10/30/2016  . Female-to-female transgender person 10/30/2016   Sees endocrinology Dr Linward Natal at [facility] in [place]  . Preventative health care 08/13/2014  . Sun-damaged skin 01/13/2014  . Tobacco use disorder 01/13/2014    Past Surgical History:  Procedure Laterality Date  . COLONOSCOPY    . POLYPECTOMY    . TONSILLECTOMY  1954    Family History  Problem Relation Age of Onset  . Colon cancer Father 53  . Hyperlipidemia Father   . Heart disease Father        stent placement  . Hypertension Father   . Cancer Father 71       colon with liver mets  . Diabetes Father   . Breast cancer Sister        BRCA2 positive  . Cancer Sister        twice, breast cancer, BRCA 2 gene mutation  . Hyperlipidemia Mother   . Alzheimer's disease Mother   . Hypertension Mother   . Diabetes Maternal Grandmother        father  . Cancer Son 18  . Colon cancer Son        Joslyn Hy - not biologically related  . Mental illness Son        bipolar  . Allergies Sister   . Breast cancer Paternal Aunt        2 paternal aunts with breast cancer postmenopausal  .  Breast cancer Cousin        3 pat female first cousins with breast cancer  . Breast cancer Other        niece with breast cancer  . Prostate cancer Neg Hx   . Esophageal cancer Neg Hx   . Rectal cancer Neg Hx   . Stomach cancer Neg Hx     Social History   Socioeconomic History  . Marital status: Married    Spouse name: Not on file  . Number of children: Not on file  . Years of education: Not on file  . Highest education level: Not on file  Occupational History  . Not on file  Social Needs  . Financial resource strain: Not on file  . Food insecurity    Worry: Not on file    Inability: Not on file  . Transportation needs    Medical: Not on file    Non-medical: Not on file  Tobacco Use  . Smoking status: Former Smoker    Packs/day: 1.00    Years: 45.00    Pack years: 45.00    Types: Cigarettes    Quit date: 05/14/2016  Years since quitting: 3.0  . Smokeless tobacco: Never Used  . Tobacco comment: 1/2  ppd  Substance and Sexual Activity  . Alcohol use: Yes    Comment: Drinks weekly  . Drug use: No  . Sexual activity: Not on file  Lifestyle  . Physical activity    Days per week: Not on file    Minutes per session: Not on file  . Stress: Not on file  Relationships  . Social Herbalist on phone: Not on file    Gets together: Not on file    Attends religious service: Not on file    Active member of club or organization: Not on file    Attends meetings of clubs or organizations: Not on file    Relationship status: Not on file  . Intimate partner violence    Fear of current or ex partner: Not on file    Emotionally abused: Not on file    Physically abused: Not on file    Forced sexual activity: Not on file  Other Topics Concern  . Not on file  Social History Narrative  . Not on file    Outpatient Medications Prior to Visit  Medication Sig Dispense Refill  . amLODipine (NORVASC) 5 MG tablet TAKE 1 TABLET (5 MG TOTAL) BY MOUTH 2 (TWO) TIMES DAILY.  180 tablet 1  . aspirin 81 MG chewable tablet Chew 81 mg by mouth daily.    . Biotin 5000 MCG TABS Take 1 tablet by mouth daily.    Marland Kitchen estradiol (ESTRACE) 2 MG tablet Take 1 tablet (2 mg total) by mouth daily. 90 tablet prn  . Fluocinolone Acetonide 0.01 % OIL Place 5 drops in ear(s) 2 (two) times daily. 20 mL 1  . KRILL OIL PO Take 1 capsule by mouth daily.    Marland Kitchen lactobacillus acidophilus (BACID) TABS tablet Take 1 tablet by mouth daily.    Marland Kitchen MELATONIN PO Take by mouth at bedtime as needed.    . Multiple Vitamin (MULTIVITAMIN) tablet Take 1 tablet by mouth daily.    . mupirocin ointment (BACTROBAN) 2 % Apply to thumb 3 times daily for 5 days 22 g 0  . psyllium (REGULOID) 0.52 G capsule Take 0.52 g by mouth daily.    Marland Kitchen spironolactone (ALDACTONE) 50 MG tablet Take 1 tablet (50 mg total) by mouth daily. 90 tablet prn  . atorvastatin (LIPITOR) 20 MG tablet TAKE 1 TABLET BY MOUTH DAILY. 90 tablet 1   No facility-administered medications prior to visit.     Allergies  Allergen Reactions  . Lisinopril Cough    hoarseness    Review of Systems  Constitutional: Negative for chills, fever and malaise/fatigue.  HENT: Negative for congestion and hearing loss.   Eyes: Negative for discharge.  Respiratory: Negative for cough, sputum production and shortness of breath.   Cardiovascular: Negative for chest pain, palpitations and leg swelling.  Gastrointestinal: Negative for abdominal pain, blood in stool, constipation, heartburn, nausea and vomiting.  Genitourinary: Negative for dysuria, frequency, hematuria and urgency.  Musculoskeletal: Negative for back pain, falls and myalgias.  Skin: Negative for rash.  Neurological: Negative for dizziness, sensory change, loss of consciousness, weakness and headaches.  Endo/Heme/Allergies: Negative for environmental allergies. Does not bruise/bleed easily.  Psychiatric/Behavioral: Negative for depression and suicidal ideas. The patient is not nervous/anxious  and does not have insomnia.        Objective:    Physical Exam Vitals signs and nursing note reviewed.  Constitutional:  General: She is not in acute distress.    Appearance: She is well-developed.  HENT:     Head: Normocephalic and atraumatic.     Nose: Nose normal.  Eyes:     General:        Right eye: No discharge.        Left eye: No discharge.  Neck:     Musculoskeletal: Normal range of motion and neck supple.  Cardiovascular:     Rate and Rhythm: Normal rate and regular rhythm.     Heart sounds: No murmur.  Pulmonary:     Effort: Pulmonary effort is normal.     Breath sounds: Normal breath sounds.  Abdominal:     General: Bowel sounds are normal.     Palpations: Abdomen is soft.     Tenderness: There is no abdominal tenderness.  Skin:    General: Skin is warm and dry.  Neurological:     Mental Status: She is alert and oriented to person, place, and time.     BP 134/82 (BP Location: Left Arm, Patient Position: Sitting, Cuff Size: Normal)   Pulse 73   Temp 98.2 F (36.8 C) (Oral)   Resp 18   Wt 209 lb 12.8 oz (95.2 kg)   SpO2 96%   BMI 31.90 kg/m  Wt Readings from Last 3 Encounters:  06/01/19 209 lb 12.8 oz (95.2 kg)  02/04/19 209 lb (94.8 kg)  11/18/18 209 lb 6.4 oz (95 kg)    Diabetic Foot Exam - Simple   No data filed     Lab Results  Component Value Date   WBC 8.4 11/17/2018   HGB 15.1 11/17/2018   HCT 44.6 11/17/2018   PLT 314.0 11/17/2018   GLUCOSE 115 (H) 02/04/2019   CHOL 179 11/17/2018   TRIG 114.0 11/17/2018   HDL 57.70 11/17/2018   LDLCALC 98 11/17/2018   ALT 35 11/17/2018   AST 24 11/17/2018   NA 135 02/04/2019   K 4.4 02/04/2019   CL 100 02/04/2019   CREATININE 0.90 02/04/2019   BUN 11 02/04/2019   CO2 28 02/04/2019   TSH 3.76 11/17/2018   PSA 0.02 (L) 11/17/2018   HGBA1C 6.3 11/17/2018   MICROALBUR <0.7 08/19/2015    Lab Results  Component Value Date   TSH 3.76 11/17/2018   Lab Results  Component Value Date    WBC 8.4 11/17/2018   HGB 15.1 11/17/2018   HCT 44.6 11/17/2018   MCV 96.7 11/17/2018   PLT 314.0 11/17/2018   Lab Results  Component Value Date   NA 135 02/04/2019   K 4.4 02/04/2019   CO2 28 02/04/2019   GLUCOSE 115 (H) 02/04/2019   BUN 11 02/04/2019   CREATININE 0.90 02/04/2019   BILITOT 0.5 11/17/2018   ALKPHOS 73 11/17/2018   AST 24 11/17/2018   ALT 35 11/17/2018   PROT 6.8 11/17/2018   ALBUMIN 4.5 11/17/2018   CALCIUM 9.8 02/04/2019   GFR 83.61 02/04/2019   Lab Results  Component Value Date   CHOL 179 11/17/2018   Lab Results  Component Value Date   HDL 57.70 11/17/2018   Lab Results  Component Value Date   LDLCALC 98 11/17/2018   Lab Results  Component Value Date   TRIG 114.0 11/17/2018   Lab Results  Component Value Date   CHOLHDL 3 11/17/2018   Lab Results  Component Value Date   HGBA1C 6.3 11/17/2018       Assessment & Plan:   Problem   List Items Addressed This Visit    Hyperlipidemia    Encouraged heart healthy diet, increase exercise, avoid trans fats, consider a krill oil cap daily      Relevant Medications   atorvastatin (LIPITOR) 20 MG tablet   Other Relevant Orders   Lipid panel   HTN (hypertension)    Well controlled, no changes to meds. Encouraged heart healthy diet such as the DASH diet and exercise as tolerated.       Relevant Medications   atorvastatin (LIPITOR) 20 MG tablet   Other Relevant Orders   TSH   CBC   Comprehensive metabolic panel   Hyperglycemia    hgba1c acceptable, minimize simple carbs. Increase exercise as tolerated.       Relevant Orders   Hemoglobin A1c   Female-to-female transgender person    Following with endocrinology and doing well         I have changed Braxton Feathers "Loni"'s atorvastatin. I am also having her maintain her aspirin, KRILL OIL PO, lactobacillus acidophilus, multivitamin, psyllium, MELATONIN PO, Biotin, spironolactone, estradiol, mupirocin ointment, Fluocinolone Acetonide,  and amLODipine.  Meds ordered this encounter  Medications  . atorvastatin (LIPITOR) 20 MG tablet    Sig: Take 1 tablet (20 mg total) by mouth daily.    Dispense:  90 tablet    Refill:  1     Penni Homans, MD

## 2019-06-05 NOTE — Addendum Note (Signed)
Addended by: Magdalene Molly A on: 06/05/2019 09:05 AM   Modules accepted: Orders

## 2019-09-07 ENCOUNTER — Other Ambulatory Visit: Payer: Self-pay

## 2019-09-07 ENCOUNTER — Other Ambulatory Visit (INDEPENDENT_AMBULATORY_CARE_PROVIDER_SITE_OTHER): Payer: PPO

## 2019-09-07 DIAGNOSIS — E785 Hyperlipidemia, unspecified: Secondary | ICD-10-CM | POA: Diagnosis not present

## 2019-09-07 DIAGNOSIS — R739 Hyperglycemia, unspecified: Secondary | ICD-10-CM

## 2019-09-07 DIAGNOSIS — I10 Essential (primary) hypertension: Secondary | ICD-10-CM | POA: Diagnosis not present

## 2019-09-07 LAB — CBC
HCT: 42.3 % (ref 39.0–52.0)
Hemoglobin: 14.4 g/dL (ref 13.0–17.0)
MCHC: 34.1 g/dL (ref 30.0–36.0)
MCV: 97 fl (ref 78.0–100.0)
Platelets: 280 10*3/uL (ref 150.0–400.0)
RBC: 4.36 Mil/uL (ref 4.22–5.81)
RDW: 13 % (ref 11.5–15.5)
WBC: 8.9 10*3/uL (ref 4.0–10.5)

## 2019-09-07 LAB — COMPREHENSIVE METABOLIC PANEL
ALT: 37 U/L (ref 0–53)
AST: 28 U/L (ref 0–37)
Albumin: 4.1 g/dL (ref 3.5–5.2)
Alkaline Phosphatase: 71 U/L (ref 39–117)
BUN: 10 mg/dL (ref 6–23)
CO2: 27 mEq/L (ref 19–32)
Calcium: 9.6 mg/dL (ref 8.4–10.5)
Chloride: 102 mEq/L (ref 96–112)
Creatinine, Ser: 0.97 mg/dL (ref 0.40–1.50)
GFR: 76.56 mL/min (ref >60.00)
Glucose, Bld: 123 mg/dL — ABNORMAL HIGH (ref 70–99)
Potassium: 4.8 mEq/L (ref 3.5–5.1)
Sodium: 136 mEq/L (ref 135–145)
Total Bilirubin: 0.6 mg/dL (ref 0.2–1.2)
Total Protein: 6.5 g/dL (ref 6.0–8.3)

## 2019-09-07 LAB — LIPID PANEL
Cholesterol: 170 mg/dL (ref 0–200)
HDL: 54.1 mg/dL (ref >39.00)
LDL Cholesterol: 89 mg/dL (ref 0–99)
NonHDL: 116.14
Total CHOL/HDL Ratio: 3
Triglycerides: 135 mg/dL (ref 0.0–149.0)
VLDL: 27 mg/dL (ref 0.0–40.0)

## 2019-09-07 LAB — TSH: TSH: 3.87 u[IU]/mL (ref 0.35–4.50)

## 2019-09-07 LAB — HEMOGLOBIN A1C: Hgb A1c MFr Bld: 6.6 % — ABNORMAL HIGH (ref 4.6–6.5)

## 2019-09-18 ENCOUNTER — Other Ambulatory Visit: Payer: Self-pay | Admitting: Family Medicine

## 2019-09-18 ENCOUNTER — Other Ambulatory Visit: Payer: Self-pay | Admitting: Endocrinology

## 2019-10-13 ENCOUNTER — Other Ambulatory Visit: Payer: Self-pay

## 2019-10-13 ENCOUNTER — Telehealth: Payer: Self-pay | Admitting: Internal Medicine

## 2019-10-13 DIAGNOSIS — D126 Benign neoplasm of colon, unspecified: Secondary | ICD-10-CM

## 2019-10-13 NOTE — Telephone Encounter (Signed)
Spoke with pt and he is aware. Pt scheduled for previsit 10/19/19@3 :30pm, covid screen scheduled at [facility] path 10/23/19@8am  (order in epic), Colon scheduled in the [facility] 10/27/19@3pm . Pt aware of appts.

## 2019-10-13 NOTE — Telephone Encounter (Signed)
Ok, given bleeding, history of adenomatous colon polyps, and family history of colon cancer and polyps, I would recommend we repeat colonoscopy at this time.  Due in 6 month anyway.  2 day prep. If all okay and hemorrhoid found we can consider banding if intermittent or troublesome rectal bleeding

## 2019-10-13 NOTE — Telephone Encounter (Signed)
Pt states he has seen BRB on the toilet tissue when he wipes for the past 4 days. Reports he is due for colon next year but he is concerned due to his family history. Last colon report stated there were some internal hemorrhoids present. Pt states it is not coloring the toilet water red but he is concerned. Please advise.

## 2019-10-19 ENCOUNTER — Encounter: Payer: Self-pay | Admitting: Internal Medicine

## 2019-10-19 ENCOUNTER — Other Ambulatory Visit: Payer: Self-pay

## 2019-10-19 ENCOUNTER — Ambulatory Visit (AMBULATORY_SURGERY_CENTER): Payer: PPO | Admitting: *Deleted

## 2019-10-19 VITALS — Temp 97.7°F | Ht 68.5 in | Wt 208.0 lb

## 2019-10-19 DIAGNOSIS — Z1159 Encounter for screening for other viral diseases: Secondary | ICD-10-CM

## 2019-10-19 DIAGNOSIS — D126 Benign neoplasm of colon, unspecified: Secondary | ICD-10-CM

## 2019-10-19 DIAGNOSIS — Z800 Family history of malignant neoplasm of digestive organs: Secondary | ICD-10-CM

## 2019-10-19 MED ORDER — NA SULFATE-K SULFATE-MG SULF 17.5-3.13-1.6 GM/177ML PO SOLN: ORAL | 0 refills | Status: DC

## 2019-10-19 NOTE — Progress Notes (Signed)
Patient is here in-person for PV. Patient denies any allergies to eggs or soy. Patient denies any problems with anesthesia/sedation. Patient denies any oxygen use at home. Patient denies taking any diet/weight loss medications or blood thinners. Patient is not being treated for MRSA or C-diff. EMMI education assisgned to the patient for the procedure, this was explained and instructions given to patient. COVID-19 screening test is on 11/13, the pt is aware.   Pt is aware that care partner will wait in the car during procedure; if they feel like they will be too hot or cold to wait in the car; they may wait in the 4 th floor lobby. Patient is aware to bring only one care partner. We want them to wear a mask (we do not have any that we can provide them), practice social distancing, and we will check their temperatures when they get here.  I did remind the patient that their care partner needs to stay in the parking lot the entire time and have a cell phone available, we will call them when the pt is ready for discharge. Patient will wear mask into building.    Suprep sample kit given to the pt. W/2day prep.

## 2019-10-23 ENCOUNTER — Ambulatory Visit (INDEPENDENT_AMBULATORY_CARE_PROVIDER_SITE_OTHER): Payer: PPO

## 2019-10-23 DIAGNOSIS — Z1159 Encounter for screening for other viral diseases: Secondary | ICD-10-CM

## 2019-10-26 LAB — SARS CORONAVIRUS 2 (TAT 6-24 HRS): SARS Coronavirus 2: NEGATIVE

## 2019-10-27 ENCOUNTER — Encounter: Payer: Self-pay | Admitting: Internal Medicine

## 2019-10-27 ENCOUNTER — Ambulatory Visit (AMBULATORY_SURGERY_CENTER): Payer: PPO | Admitting: Internal Medicine

## 2019-10-27 ENCOUNTER — Other Ambulatory Visit: Payer: Self-pay

## 2019-10-27 VITALS — BP 140/89 | HR 69 | Temp 98.3°F | Resp 16 | Ht 68.0 in | Wt 208.0 lb

## 2019-10-27 DIAGNOSIS — D124 Benign neoplasm of descending colon: Secondary | ICD-10-CM

## 2019-10-27 DIAGNOSIS — D122 Benign neoplasm of ascending colon: Secondary | ICD-10-CM | POA: Diagnosis not present

## 2019-10-27 DIAGNOSIS — Z86010 Personal history of colonic polyps: Secondary | ICD-10-CM

## 2019-10-27 DIAGNOSIS — D126 Benign neoplasm of colon, unspecified: Secondary | ICD-10-CM

## 2019-10-27 DIAGNOSIS — Z800 Family history of malignant neoplasm of digestive organs: Secondary | ICD-10-CM

## 2019-10-27 DIAGNOSIS — D123 Benign neoplasm of transverse colon: Secondary | ICD-10-CM | POA: Diagnosis not present

## 2019-10-27 MED ORDER — SODIUM CHLORIDE 0.9 % IV SOLN: 500.0000 mL | Freq: Once | INTRAVENOUS | Status: DC

## 2019-10-27 NOTE — Progress Notes (Signed)
Pt's states no medical or surgical changes since previsit or office visit. 

## 2019-10-27 NOTE — Progress Notes (Signed)
Called to room to assist during endoscopic procedure.  Patient ID and intended procedure confirmed with present staff. Received instructions for my participation in the procedure from the performing physician.  

## 2019-10-27 NOTE — Op Note (Signed)
[facility] Patient Name: Taylor Newman Procedure Date: 10/27/2019 3:04 PM MRN: [id number] Endoscopist: Jerene Bears , MD Age: 68 Referring MD:  Date of Birth: [birth year] Gender: Female Account #: [id number] Procedure:                Colonoscopy Indications:              High risk colon cancer surveillance: Personal                            history of multiple (3 or more) adenomas, Last                            colonoscopy: May 2018 Medicines:                Monitored Anesthesia Care Procedure:                Pre-Anesthesia Assessment:                           - Prior to the procedure, a History and Physical                            was performed, and patient medications and                            allergies were reviewed. The patient's tolerance of                            previous anesthesia was also reviewed. The risks                            and benefits of the procedure and the sedation                            options and risks were discussed with the patient.                            All questions were answered, and informed consent                            was obtained. Prior Anticoagulants: The patient has                            taken no previous anticoagulant or antiplatelet                            agents. ASA Grade Assessment: II - A patient with                            mild systemic disease. After reviewing the risks                            and benefits, the patient was deemed in  satisfactory condition to undergo the procedure.                           After obtaining informed consent, the colonoscope                            was passed under direct vision. Throughout the                            procedure, the patient's blood pressure, pulse, and                            oxygen saturations were monitored continuously. The                            Colonoscope was introduced through the anus and                             advanced to the cecum, identified by appendiceal                            orifice and ileocecal valve. The colonoscopy was                            performed without difficulty. The patient tolerated                            the procedure well. The quality of the bowel                            preparation was excellent. The ileocecal valve,                            appendiceal orifice, and rectum were photographed.                            The bowel preparation used was 2 day MiraLax +                            Suprep via split dose instruction. Scope In: 3:12:54 PM Scope Out: 3:36:53 PM Scope Withdrawal Time: 0 hours 19 minutes 8 seconds  Total Procedure Duration: 0 hours 23 minutes 59 seconds  Findings:                 The digital rectal exam was normal.                           Three sessile polyps were found in the ascending                            colon. The polyps were 3 to 6 mm in size. These                            polyps were removed with a cold  snare. Resection                            and retrieval were complete.                           A 18 mm polyp was found in the transverse colon.                            The polyp was sessile. The polyp was removed with a                            cold snare. Resection and retrieval were complete.                           Four sessile polyps were found in the transverse                            colon. The polyps were 3 to 6 mm in size. These                            polyps were removed with a cold snare. Resection                            and retrieval were complete.                           A 5 mm polyp was found in the descending colon. The                            polyp was sessile. The polyp was removed with a                            cold snare. Resection and retrieval were complete.                           Multiple small and large-mouthed diverticula were                             found in the sigmoid colon and hepatic flexure.                           Internal hemorrhoids were found during retroflexion. Complications:            No immediate complications. Estimated Blood Loss:     Estimated blood loss was minimal. Impression:               - Three 3 to 6 mm polyps in the ascending colon,                            removed with a cold snare. Resected and retrieved.                           -  One 18 mm polyp in the transverse colon, removed                            with a cold snare. Resected and retrieved.                           - Four 3 to 6 mm polyps in the transverse colon,                            removed with a cold snare. Resected and retrieved.                           - One 5 mm polyp in the descending colon, removed                            with a cold snare. Resected and retrieved.                           - Diverticulosis in the sigmoid colon and at the                            hepatic flexure.                           - Internal hemorrhoids. Recommendation:           - Patient has a contact number available for                            emergencies. The signs and symptoms of potential                            delayed complications were discussed with the                            patient. Return to normal activities tomorrow.                            Written discharge instructions were provided to the                            patient.                           - Resume previous diet.                           - Continue present medications.                           - Await pathology results.                           - Repeat colonoscopy is recommended for  surveillance. The colonoscopy date will be                            determined after pathology results from today's                            exam become available for review. Jerene Bears, MD 10/27/2019 3:42:11 PM This report  has been signed electronically.

## 2019-10-27 NOTE — Progress Notes (Signed)
PT taken to PACU. Monitors in place. VSS. Report given to RN. 

## 2019-10-27 NOTE — Patient Instructions (Signed)
Read all of the handouts and pamphlets given to you by your recovery room nurse.  Thank-you for choosing [place] for your healthcare needs today.  YOU HAD AN ENDOSCOPIC PROCEDURE TODAY AT [facility]:   Refer to the procedure report that was given to you for any specific questions about what was found during the examination.  If the procedure report does not answer your questions, please call your gastroenterologist to clarify.  If you requested that your care partner not be given the details of your procedure findings, then the procedure report has been included in a sealed envelope for you to review at your convenience later.  YOU SHOULD EXPECT: Some feelings of bloating in the abdomen. Passage of more gas than usual.  Walking can help get rid of the air that was put into your GI tract during the procedure and reduce the bloating. If you had a lower endoscopy (such as a colonoscopy or flexible sigmoidoscopy) you may notice spotting of blood in your stool or on the toilet paper. If you underwent a bowel prep for your procedure, you may not have a normal bowel movement for a few days.  Please Note:  You might notice some irritation and congestion in your nose or some drainage.  This is from the oxygen used during your procedure.  There is no need for concern and it should clear up in a day or so.  SYMPTOMS TO REPORT IMMEDIATELY:   Following lower endoscopy (colonoscopy or flexible sigmoidoscopy):  Excessive amounts of blood in the stool  Significant tenderness or worsening of abdominal pains  Swelling of the abdomen that is new, acute  Fever of 100F or higher   Following upper endoscopy (EGD)  Vomiting of blood or coffee ground material  New chest pain or pain under the shoulder blades  Painful or persistently difficult swallowing  New shortness of breath  Fever of 100F or higher  Black, tarry-looking stools  For urgent or emergent issues, a gastroenterologist can be reached  at any hour by calling [phone].   DIET:  We do recommend a small meal at first, but then you may proceed to your regular diet.  Drink plenty of fluids but you should avoid alcoholic beverages for 24 hours. Try to increase the fiber in your diet, and drink plenty of water.  ACTIVITY:  You should plan to take it easy for the rest of today and you should NOT DRIVE or use heavy machinery until tomorrow (because of the sedation medicines used during the test).    FOLLOW UP: Our staff will call the number listed on your records 48-72 hours following your procedure to check on you and address any questions or concerns that you may have regarding the information given to you following your procedure. If we do not reach you, we will leave a message.  We will attempt to reach you two times.  During this call, we will ask if you have developed any symptoms of COVID 19. If you develop any symptoms (ie: fever, flu-like symptoms, shortness of breath, cough etc.) before then, please call [phone].  If you test positive for Covid 19 in the 2 weeks post procedure, please call and report this information to [place].    If any biopsies were taken you will be contacted by phone or by letter within the next 1-3 weeks.  Please call us at [phone] if you have not heard about the biopsies in 3 weeks.    SIGNATURES/CONFIDENTIALITY:  You and/or your care partner have signed paperwork which will be entered into your electronic medical record.  These signatures attest to the fact that that the information above on your After Visit Summary has been reviewed and is understood.  Full responsibility of the confidentiality of this discharge information lies with you and/or your care-partner.

## 2019-10-29 ENCOUNTER — Telehealth: Payer: Self-pay

## 2019-10-29 NOTE — Telephone Encounter (Signed)
Covid-19 screening questions   Do you now or have you had a fever in the last 14 days? No.  Do you have any respiratory symptoms of shortness of breath or cough now or in the last 14 days? No.  Do you have any family members or close contacts with diagnosed or suspected Covid-19 in the past 14 days? No.  Have you been tested for Covid-19 and found to be positive? No.       Follow up Call-  Call back number 10/27/2019 04/30/2017  Post procedure Call Back phone  # [phone]  Permission to leave phone message Yes Yes  Some recent data might be hidden     Patient questions:  Do you have a fever, pain , or abdominal swelling? No. Pain Score  0 *  Have you tolerated food without any problems? Yes.    Have you been able to return to your normal activities? Yes.    Do you have any questions about your discharge instructions: Diet   No. Medications  No. Follow up visit  No.  Do you have questions or concerns about your Care? No.  Actions: * If pain score is 4 or above: No action needed, pain <4.

## 2019-11-03 ENCOUNTER — Encounter: Payer: Self-pay | Admitting: Internal Medicine

## 2019-11-27 ENCOUNTER — Other Ambulatory Visit: Payer: Self-pay | Admitting: Family Medicine

## 2019-11-27 DIAGNOSIS — E785 Hyperlipidemia, unspecified: Secondary | ICD-10-CM

## 2019-11-27 NOTE — Telephone Encounter (Signed)
Last OV 06/01/19 Last refill 06/01/19 #90/1 Next OV 12/14/19

## 2019-12-01 ENCOUNTER — Encounter: Payer: PPO | Admitting: Family Medicine

## 2019-12-14 ENCOUNTER — Other Ambulatory Visit: Payer: Self-pay

## 2019-12-14 ENCOUNTER — Ambulatory Visit (INDEPENDENT_AMBULATORY_CARE_PROVIDER_SITE_OTHER): Payer: PPO | Admitting: Family Medicine

## 2019-12-14 ENCOUNTER — Encounter: Payer: Self-pay | Admitting: Family Medicine

## 2019-12-14 DIAGNOSIS — E782 Mixed hyperlipidemia: Secondary | ICD-10-CM | POA: Diagnosis not present

## 2019-12-14 DIAGNOSIS — R739 Hyperglycemia, unspecified: Secondary | ICD-10-CM | POA: Diagnosis not present

## 2019-12-14 DIAGNOSIS — I10 Essential (primary) hypertension: Secondary | ICD-10-CM

## 2019-12-14 DIAGNOSIS — D126 Benign neoplasm of colon, unspecified: Secondary | ICD-10-CM | POA: Diagnosis not present

## 2019-12-14 DIAGNOSIS — Z0000 Encounter for general adult medical examination without abnormal findings: Secondary | ICD-10-CM | POA: Diagnosis not present

## 2019-12-14 DIAGNOSIS — F640 Transsexualism: Secondary | ICD-10-CM

## 2019-12-14 DIAGNOSIS — E119 Type 2 diabetes mellitus without complications: Secondary | ICD-10-CM | POA: Diagnosis not present

## 2019-12-14 DIAGNOSIS — Z789 Other specified health status: Secondary | ICD-10-CM

## 2019-12-14 LAB — CBC
HCT: 43.8 % (ref 39.0–52.0)
Hemoglobin: 14.9 g/dL (ref 13.0–17.0)
MCHC: 34 g/dL (ref 30.0–36.0)
MCV: 97.4 fl (ref 78.0–100.0)
Platelets: 299 10*3/uL (ref 150.0–400.0)
RBC: 4.49 Mil/uL (ref 4.22–5.81)
RDW: 12.7 % (ref 11.5–15.5)
WBC: 10.1 10*3/uL (ref 4.0–10.5)

## 2019-12-14 LAB — COMPREHENSIVE METABOLIC PANEL
ALT: 35 U/L (ref 0–53)
AST: 25 U/L (ref 0–37)
Albumin: 4.4 g/dL (ref 3.5–5.2)
Alkaline Phosphatase: 79 U/L (ref 39–117)
BUN: 10 mg/dL (ref 6–23)
CO2: 29 mEq/L (ref 19–32)
Calcium: 10.2 mg/dL (ref 8.4–10.5)
Chloride: 101 mEq/L (ref 96–112)
Creatinine, Ser: 0.86 mg/dL (ref 0.40–1.50)
GFR: 87.89 mL/min (ref >60.00)
Glucose, Bld: 110 mg/dL — ABNORMAL HIGH (ref 70–99)
Potassium: 5.1 mEq/L (ref 3.5–5.1)
Sodium: 136 mEq/L (ref 135–145)
Total Bilirubin: 0.5 mg/dL (ref 0.2–1.2)
Total Protein: 6.8 g/dL (ref 6.0–8.3)

## 2019-12-14 LAB — LIPID PANEL
Cholesterol: 207 mg/dL — ABNORMAL HIGH (ref 0–200)
HDL: 59.5 mg/dL (ref >39.00)
LDL Cholesterol: 116 mg/dL — ABNORMAL HIGH (ref 0–99)
NonHDL: 147.21
Total CHOL/HDL Ratio: 3
Triglycerides: 156 mg/dL — ABNORMAL HIGH (ref 0.0–149.0)
VLDL: 31.2 mg/dL (ref 0.0–40.0)

## 2019-12-14 LAB — HEMOGLOBIN A1C: Hgb A1c MFr Bld: 6.8 % — ABNORMAL HIGH (ref 4.6–6.5)

## 2019-12-14 LAB — TSH: TSH: 4 u[IU]/mL (ref 0.35–4.50)

## 2019-12-14 NOTE — Assessment & Plan Note (Signed)
Due for colonoscopy in 2023

## 2019-12-14 NOTE — Assessment & Plan Note (Signed)
hgba1c acceptable, minimize simple carbs. Increase exercise as tolerated.  

## 2019-12-14 NOTE — Patient Instructions (Addendum)
Multivitamin with minerals, selenium Vitamin D 2000 IU daily Aspirin 81 mg daily  Melatonin 1-5 mg at bedtime   Omron blood pressure cuff, upper arm cuff Pulse oximeter want oxygen in 90s   Preventive Care 65 Years and Older, Female Preventive care refers to lifestyle choices and visits with your health care provider that can promote health and wellness. This includes:  A yearly physical exam. This is also called an annual well check.  Regular dental and eye exams.  Immunizations.  Screening for certain conditions.  Healthy lifestyle choices, such as diet and exercise. What can I expect for my preventive care visit? Physical exam Your health care provider will check:  Height and weight. These may be used to calculate body mass index (BMI), which is a measurement that tells if you are at a healthy weight.  Heart rate and blood pressure.  Your skin for abnormal spots. Counseling Your health care provider may ask you questions about:  Alcohol, tobacco, and drug use.  Emotional well-being.  Home and relationship well-being.  Sexual activity.  Eating habits.  History of falls.  Memory and ability to understand (cognition).  Work and work Statistician.  Pregnancy and menstrual history. What immunizations do I need?  Influenza (flu) vaccine  This is recommended every year. Tetanus, diphtheria, and pertussis (Tdap) vaccine  You may need a Td booster every 10 years. Varicella (chickenpox) vaccine  You may need this vaccine if you have not already been vaccinated. Zoster (shingles) vaccine  You may need this after age 20. Pneumococcal conjugate (PCV13) vaccine  One dose is recommended after age 32. Pneumococcal polysaccharide (PPSV23) vaccine  One dose is recommended after age 79. Measles, mumps, and rubella (MMR) vaccine  You may need at least one dose of MMR if you were born in 1957 or later. You may also need a second dose. Meningococcal conjugate  (MenACWY) vaccine  You may need this if you have certain conditions. Hepatitis A vaccine  You may need this if you have certain conditions or if you travel or work in places where you may be exposed to hepatitis A. Hepatitis B vaccine  You may need this if you have certain conditions or if you travel or work in places where you may be exposed to hepatitis B. Haemophilus influenzae type b (Hib) vaccine  You may need this if you have certain conditions. You may receive vaccines as individual doses or as more than one vaccine together in one shot (combination vaccines). Talk with your health care provider about the risks and benefits of combination vaccines. What tests do I need? Blood tests  Lipid and cholesterol levels. These may be checked every 5 years, or more frequently depending on your overall health.  Hepatitis C test.  Hepatitis B test. Screening  Lung cancer screening. You may have this screening every year starting at age 60 if you have a 30-pack-year history of smoking and currently smoke or have quit within the past 15 years.  Colorectal cancer screening. All adults should have this screening starting at age 87 and continuing until age 34. Your health care provider may recommend screening at age 45 if you are at increased risk. You will have tests every 1-10 years, depending on your results and the type of screening test.  Diabetes screening. This is done by checking your blood sugar (glucose) after you have not eaten for a while (fasting). You may have this done every 1-3 years.  Mammogram. This may be done every 1-2  years. Talk with your health care provider about how often you should have regular mammograms.  BRCA-related cancer screening. This may be done if you have a family history of breast, ovarian, tubal, or peritoneal cancers. Other tests  Sexually transmitted disease (STD) testing.  Bone density scan. This is done to screen for osteoporosis. You may have this  done starting at age 92. Follow these instructions at home: Eating and drinking  Eat a diet that includes fresh fruits and vegetables, whole grains, lean protein, and low-fat dairy products. Limit your intake of foods with high amounts of sugar, saturated fats, and salt.  Take vitamin and mineral supplements as recommended by your health care provider.  Do not drink alcohol if your health care provider tells you not to drink.  If you drink alcohol: ? Limit how much you have to 0-1 drink a day. ? Be aware of how much alcohol is in your drink. In the [place] one drink equals one 12 oz bottle of beer (355 mL), one 5 oz glass of wine (148 mL), or one 1 oz glass of hard liquor (44 mL). Lifestyle  Take daily care of your teeth and gums.  Stay active. Exercise for at least 30 minutes on 5 or more days each week.  Do not use any products that contain nicotine or tobacco, such as cigarettes, e-cigarettes, and chewing tobacco. If you need help quitting, ask your health care provider.  If you are sexually active, practice safe sex. Use a condom or other form of protection in order to prevent STIs (sexually transmitted infections).  Talk with your health care provider about taking a low-dose aspirin or statin. What's next?  Go to your health care provider once a year for a well check visit.  Ask your health care provider how often you should have your eyes and teeth checked.  Stay up to date on all vaccines. This information is not intended to replace advice given to you by your health care provider. Make sure you discuss any questions you have with your health care provider. Document Revised: 11/20/2018 Document Reviewed: 11/20/2018 Elsevier Patient Education  2020 [employer].

## 2019-12-14 NOTE — Assessment & Plan Note (Signed)
Is doing well and following with Endocrinology

## 2019-12-14 NOTE — Assessment & Plan Note (Signed)
Patient encouraged to maintain heart healthy diet, regular exercise, adequate sleep. Consider daily probiotics. Take medications as prescribed 

## 2019-12-14 NOTE — Assessment & Plan Note (Signed)
Well controlled, no changes to meds. Encouraged heart healthy diet such as the DASH diet and exercise as tolerated.  °

## 2019-12-14 NOTE — Progress Notes (Signed)
Subjective:    Patient ID: Taylor Newman, adult    DOB: 1951-11-18, 69 y.o.   MRN: [id number]  No chief complaint on file.   HPI Patient is in today for annual preventative exam and follow up on chronic medical concerns including hyperlipidemia, hyperglycemia and more. No recent febrile illness or hospitalizations. No polyuria or polydipsia. Is working well as a Presenter, broadcasting at [employer]. Is trying to eat well and stay well. Denies CP/palp/SOB/HA/congestion/fevers/GI or GU c/o. Taking meds as prescribed.  Past Medical History:  Diagnosis Date  . Chest pain 01/12/2013  . Dyspnea 05/20/2017  . FHx: BRCA2 gene positive 02/20/2015   Sister with this and breast cancer.  . Grief reaction 07/15/2013  . Heart murmur   . History of chicken pox   . History of colon polyps   . History of tobacco abuse 01/13/2014   Last cigarettes 05/30/2016  . Hyperkalemia 02/11/2017  . Hyperlipidemia   . Hypertension   . Laceration of hand 10/24/2014  . Female-to-female transgender person 10/30/2016  . Female-to-female transgender person 10/30/2016   Sees endocrinology Dr Linward Natal at [facility] in [place]  . Preventative health care 08/13/2014  . Sun-damaged skin 01/13/2014  . Tobacco use disorder 01/13/2014    Past Surgical History:  Procedure Laterality Date  . COLONOSCOPY  2018  . POLYPECTOMY    . TONSILLECTOMY  1954    Family History  Problem Relation Age of Onset  . Colon cancer Father 75  . Hyperlipidemia Father   . Heart disease Father        stent placement  . Hypertension Father   . Cancer Father 46       colon with liver mets  . Diabetes Father   . Breast cancer Sister        BRCA2 positive  . Cancer Sister        twice, breast cancer, BRCA 2 gene mutation  . Hyperlipidemia Mother   . Alzheimer's disease Mother   . Hypertension Mother   . Diabetes Maternal Grandmother        father  . Cancer Son 86  . Colon cancer Son        Joslyn Hy - not biologically  related  . Mental illness Son        bipolar  . Allergies Sister   . Breast cancer Paternal Aunt        2 paternal aunts with breast cancer postmenopausal  . Breast cancer Cousin        3 pat female first cousins with breast cancer  . Breast cancer Other        niece with breast cancer  . Colon cancer Maternal Uncle   . Prostate cancer Neg Hx   . Esophageal cancer Neg Hx   . Rectal cancer Neg Hx   . Stomach cancer Neg Hx     Social History   Socioeconomic History  . Marital status: Married    Spouse name: Not on file  . Number of children: Not on file  . Years of education: Not on file  . Highest education level: Not on file  Occupational History  . Not on file  Tobacco Use  . Smoking status: Former Smoker    Packs/day: 1.00    Years: 45.00    Pack years: 45.00    Types: Cigarettes    Quit date: 05/14/2016    Years since quitting: 3.5  . Smokeless tobacco: Never Used  .  Tobacco comment: 1/2  ppd  Substance and Sexual Activity  . Alcohol use: Yes    Alcohol/week: 12.0 standard drinks    Types: 12 Cans of beer per week    Comment: Drinks weekly  . Drug use: No  . Sexual activity: Not on file  Other Topics Concern  . Not on file  Social History Narrative  . Not on file   Social Determinants of Health   Financial Resource Strain:   . Difficulty of Paying Living Expenses: Not on file  Food Insecurity:   . Worried About Charity fundraiser in the Last Year: Not on file  . Ran Out of Food in the Last Year: Not on file  Transportation Needs:   . Lack of Transportation (Medical): Not on file  . Lack of Transportation (Non-Medical): Not on file  Physical Activity:   . Days of Exercise per Week: Not on file  . Minutes of Exercise per Session: Not on file  Stress:   . Feeling of Stress : Not on file  Social Connections:   . Frequency of Communication with Friends and Family: Not on file  . Frequency of Social Gatherings with Friends and Family: Not on file  .  Attends Religious Services: Not on file  . Active Member of Clubs or Organizations: Not on file  . Attends Archivist Meetings: Not on file  . Marital Status: Not on file  Intimate Partner Violence:   . Fear of Current or Ex-Partner: Not on file  . Emotionally Abused: Not on file  . Physically Abused: Not on file  . Sexually Abused: Not on file    Outpatient Medications Prior to Visit  Medication Sig Dispense Refill  . amLODipine (NORVASC) 5 MG tablet TAKE 1 TABLET (5 MG TOTAL) BY MOUTH 2 (TWO) TIMES DAILY. 180 tablet 1  . aspirin 81 MG chewable tablet Chew 81 mg by mouth daily.    Marland Kitchen atorvastatin (LIPITOR) 20 MG tablet TAKE 1 TABLET BY MOUTH ONCE DAILY 90 tablet 1  . Biotin 5000 MCG TABS Take 1 tablet by mouth daily.    Marland Kitchen estradiol (ESTRACE) 2 MG tablet TAKE 1 TABLET (2 MG TOTAL) BY MOUTH DAILY. 90 tablet PRN  . Fluocinolone Acetonide 0.01 % OIL Place 5 drops in ear(s) 2 (two) times daily. 20 mL 1  . KRILL OIL PO Take 1 capsule by mouth daily.    Marland Kitchen lactobacillus acidophilus (BACID) TABS tablet Take 1 tablet by mouth daily.    Marland Kitchen MELATONIN PO Take by mouth at bedtime as needed.    . Multiple Vitamin (MULTIVITAMIN) tablet Take 1 tablet by mouth daily.    . psyllium (REGULOID) 0.52 G capsule Take 0.52 g by mouth daily.    Marland Kitchen spironolactone (ALDACTONE) 50 MG tablet TAKE 1 TABLET (50 MG TOTAL) BY MOUTH DAILY. 90 tablet PRN   No facility-administered medications prior to visit.    Allergies  Allergen Reactions  . Lisinopril Cough    hoarseness    Review of Systems  Constitutional: Negative for chills, fever and malaise/fatigue.  HENT: Negative for congestion and hearing loss.   Eyes: Negative for discharge.  Respiratory: Negative for cough, sputum production and shortness of breath.   Cardiovascular: Negative for chest pain, palpitations and leg swelling.  Gastrointestinal: Negative for abdominal pain, blood in stool, constipation, diarrhea, heartburn, nausea and vomiting.    Genitourinary: Negative for dysuria, frequency, hematuria and urgency.  Musculoskeletal: Negative for back pain, falls and myalgias.  Skin:  Negative for rash.  Neurological: Negative for dizziness, sensory change, loss of consciousness, weakness and headaches.  Endo/Heme/Allergies: Negative for environmental allergies. Does not bruise/bleed easily.  Psychiatric/Behavioral: Negative for depression and suicidal ideas. The patient is not nervous/anxious and does not have insomnia.        Objective:    Physical Exam Constitutional:      General: She is not in acute distress.    Appearance: She is well-developed.  HENT:     Head: Normocephalic and atraumatic.  Eyes:     Conjunctiva/sclera: Conjunctivae normal.  Neck:     Thyroid: No thyromegaly.  Cardiovascular:     Rate and Rhythm: Normal rate and regular rhythm.     Heart sounds: Normal heart sounds. No murmur.  Pulmonary:     Effort: Pulmonary effort is normal. No respiratory distress.     Breath sounds: Normal breath sounds.  Abdominal:     General: Bowel sounds are normal. There is no distension.     Palpations: Abdomen is soft. There is no mass.     Tenderness: There is no abdominal tenderness.  Musculoskeletal:     Cervical back: Neck supple.  Lymphadenopathy:     Cervical: No cervical adenopathy.  Skin:    General: Skin is warm and dry.  Neurological:     Mental Status: She is alert and oriented to person, place, and time.  Psychiatric:        Behavior: Behavior normal.     BP (!) 137/92 (BP Location: Left Arm, Patient Position: Sitting, Cuff Size: Normal)   Pulse 79   Temp 98 F (36.7 C) (Temporal)   Resp 18   Wt 211 lb (95.7 kg)   SpO2 97%   BMI 32.08 kg/m  Wt Readings from Last 3 Encounters:  12/14/19 211 lb (95.7 kg)  10/27/19 208 lb (94.3 kg)  10/19/19 208 lb (94.3 kg)    Diabetic Foot Exam - Simple   No data filed     Lab Results  Component Value Date   WBC 8.9 09/07/2019   HGB 14.4  09/07/2019   HCT 42.3 09/07/2019   PLT 280.0 09/07/2019   GLUCOSE 123 (H) 09/07/2019   CHOL 170 09/07/2019   TRIG 135.0 09/07/2019   HDL 54.10 09/07/2019   LDLCALC 89 09/07/2019   ALT 37 09/07/2019   AST 28 09/07/2019   NA 136 09/07/2019   K 4.8 09/07/2019   CL 102 09/07/2019   CREATININE 0.97 09/07/2019   BUN 10 09/07/2019   CO2 27 09/07/2019   TSH 3.87 09/07/2019   PSA 0.02 (L) 11/17/2018   HGBA1C 6.6 (H) 09/07/2019   MICROALBUR <0.7 08/19/2015    Lab Results  Component Value Date   TSH 3.87 09/07/2019   Lab Results  Component Value Date   WBC 8.9 09/07/2019   HGB 14.4 09/07/2019   HCT 42.3 09/07/2019   MCV 97.0 09/07/2019   PLT 280.0 09/07/2019   Lab Results  Component Value Date   NA 136 09/07/2019   K 4.8 09/07/2019   CO2 27 09/07/2019   GLUCOSE 123 (H) 09/07/2019   BUN 10 09/07/2019   CREATININE 0.97 09/07/2019   BILITOT 0.6 09/07/2019   ALKPHOS 71 09/07/2019   AST 28 09/07/2019   ALT 37 09/07/2019   PROT 6.5 09/07/2019   ALBUMIN 4.1 09/07/2019   CALCIUM 9.6 09/07/2019   GFR 76.56 09/07/2019   Lab Results  Component Value Date   CHOL 170 09/07/2019   Lab Results  Component Value Date   HDL 54.10 09/07/2019   Lab Results  Component Value Date   LDLCALC 89 09/07/2019   Lab Results  Component Value Date   TRIG 135.0 09/07/2019   Lab Results  Component Value Date   CHOLHDL 3 09/07/2019   Lab Results  Component Value Date   HGBA1C 6.6 (H) 09/07/2019       Assessment & Plan:   Problem List Items Addressed This Visit    Colon polyps    Due for colonoscopy in 2023      Hyperlipidemia    Encouraged heart healthy diet, increase exercise, avoid trans fats, consider a krill oil cap daily, tolerating Atorvastatin      Relevant Orders   Lipid panel   HTN (hypertension)    Well controlled, no changes to meds. Encouraged heart healthy diet such as the DASH diet and exercise as tolerated.       Relevant Orders   CBC   CMP   TSH    Diabetes mellitus without complication (HCC)    FPUL2S acceptable, minimize simple carbs. Increase exercise as tolerated.       Preventative health care    Patient encouraged to maintain heart healthy diet, regular exercise, adequate sleep. Consider daily probiotics. Take medications as prescribed      Female-to-female transgender person    Is doing well and following with Endocrinology         I am having Braxton Feathers "Jaeda" maintain her aspirin, KRILL OIL PO, lactobacillus acidophilus, multivitamin, psyllium, MELATONIN PO, Biotin, Fluocinolone Acetonide, estradiol, amLODipine, spironolactone, and atorvastatin.  No orders of the defined types were placed in this encounter.    Penni Homans, MD

## 2019-12-14 NOTE — Assessment & Plan Note (Addendum)
Encouraged heart healthy diet, increase exercise, avoid trans fats, consider a krill oil cap daily, tolerating Atorvastatin 

## 2020-02-03 ENCOUNTER — Ambulatory Visit: Payer: PPO | Admitting: Endocrinology

## 2020-02-04 ENCOUNTER — Other Ambulatory Visit: Payer: Self-pay

## 2020-02-08 ENCOUNTER — Other Ambulatory Visit: Payer: Self-pay

## 2020-02-08 ENCOUNTER — Ambulatory Visit: Payer: PPO | Admitting: Endocrinology

## 2020-02-08 ENCOUNTER — Encounter: Payer: Self-pay | Admitting: Endocrinology

## 2020-02-08 VITALS — BP 110/80 | HR 85 | Ht 68.0 in | Wt 206.6 lb

## 2020-02-08 DIAGNOSIS — F640 Transsexualism: Secondary | ICD-10-CM

## 2020-02-08 DIAGNOSIS — Z789 Other specified health status: Secondary | ICD-10-CM

## 2020-02-08 NOTE — Progress Notes (Signed)
Subjective:    Patient ID: Taylor Newman, adult    DOB: 07-27-1951, 69 y.o.   MRN: [id number]  HPI  Pt returns for f/u of transgender state (M to F): Surgery: never Medication: estradiol and aldactone since 2017 Counseling: ongoing Other:she quit smoking in 2017; aldactone dosage has been limited by hyperkalemia; she is physically but not legally separated from her wife; she has 1 biological child, and 2 stepchildren Interval hx: She has breast growth continues.  pt states she feels well in general. Past Medical History:  Diagnosis Date  . Chest pain 01/12/2013  . Dyspnea 05/20/2017  . FHx: BRCA2 gene positive 02/20/2015   Sister with this and breast cancer.  . Grief reaction 07/15/2013  . Heart murmur   . History of chicken pox   . History of colon polyps   . History of tobacco abuse 01/13/2014   Last cigarettes 05/30/2016  . Hyperkalemia 02/11/2017  . Hyperlipidemia   . Hypertension   . Laceration of hand 10/24/2014  . Female-to-female transgender person 10/30/2016  . Female-to-female transgender person 10/30/2016   Sees endocrinology Dr Linward Natal at [facility] in [place]  . Preventative health care 08/13/2014  . Sun-damaged skin 01/13/2014  . Tobacco use disorder 01/13/2014    Past Surgical History:  Procedure Laterality Date  . COLONOSCOPY  2018  . POLYPECTOMY    . TONSILLECTOMY  1954    Social History   Socioeconomic History  . Marital status: Married    Spouse name: Not on file  . Number of children: Not on file  . Years of education: Not on file  . Highest education level: Not on file  Occupational History  . Not on file  Tobacco Use  . Smoking status: Former Smoker    Packs/day: 1.00    Years: 45.00    Pack years: 45.00    Types: Cigarettes    Quit date: 05/14/2016    Years since quitting: 3.7  . Smokeless tobacco: Never Used  . Tobacco comment: 1/2  ppd  Substance and Sexual Activity  . Alcohol use: Yes    Alcohol/week: 12.0 standard  drinks    Types: 12 Cans of beer per week    Comment: Drinks weekly  . Drug use: No  . Sexual activity: Not on file  Other Topics Concern  . Not on file  Social History Narrative  . Not on file   Social Determinants of Health   Financial Resource Strain:   . Difficulty of Paying Living Expenses: Not on file  Food Insecurity:   . Worried About Charity fundraiser in the Last Year: Not on file  . Ran Out of Food in the Last Year: Not on file  Transportation Needs:   . Lack of Transportation (Medical): Not on file  . Lack of Transportation (Non-Medical): Not on file  Physical Activity:   . Days of Exercise per Week: Not on file  . Minutes of Exercise per Session: Not on file  Stress:   . Feeling of Stress : Not on file  Social Connections:   . Frequency of Communication with Friends and Family: Not on file  . Frequency of Social Gatherings with Friends and Family: Not on file  . Attends Religious Services: Not on file  . Active Member of Clubs or Organizations: Not on file  . Attends Archivist Meetings: Not on file  . Marital Status: Not on file  Intimate Partner Violence:   .  Fear of Current or Ex-Partner: Not on file  . Emotionally Abused: Not on file  . Physically Abused: Not on file  . Sexually Abused: Not on file    Current Outpatient Medications on File Prior to Visit  Medication Sig Dispense Refill  . aspirin 81 MG chewable tablet Chew 81 mg by mouth daily.    Marland Kitchen atorvastatin (LIPITOR) 20 MG tablet TAKE 1 TABLET BY MOUTH ONCE DAILY 90 tablet 1  . Biotin 5000 MCG TABS Take 1 tablet by mouth daily.    Marland Kitchen estradiol (ESTRACE) 2 MG tablet TAKE 1 TABLET (2 MG TOTAL) BY MOUTH DAILY. 90 tablet PRN  . Fluocinolone Acetonide 0.01 % OIL Place 5 drops in ear(s) 2 (two) times daily. 20 mL 1  . KRILL OIL PO Take 1 capsule by mouth daily.    Marland Kitchen lactobacillus acidophilus (BACID) TABS tablet Take 1 tablet by mouth daily.    Marland Kitchen MELATONIN PO Take by mouth at bedtime as needed.     . Multiple Vitamin (MULTIVITAMIN) tablet Take 1 tablet by mouth daily.    . psyllium (REGULOID) 0.52 G capsule Take 0.52 g by mouth daily.    Marland Kitchen spironolactone (ALDACTONE) 50 MG tablet TAKE 1 TABLET (50 MG TOTAL) BY MOUTH DAILY. 90 tablet PRN  . amLODipine (NORVASC) 5 MG tablet TAKE 1 TABLET (5 MG TOTAL) BY MOUTH 2 (TWO) TIMES DAILY. 180 tablet 1   No current facility-administered medications on file prior to visit.    Allergies  Allergen Reactions  . Lisinopril Cough    hoarseness    Family History  Problem Relation Age of Onset  . Colon cancer Father 85  . Hyperlipidemia Father   . Heart disease Father        stent placement  . Hypertension Father   . Cancer Father 78       colon with liver mets  . Diabetes Father   . Breast cancer Sister        BRCA2 positive  . Cancer Sister        twice, breast cancer, BRCA 2 gene mutation  . Hyperlipidemia Mother   . Alzheimer's disease Mother   . Hypertension Mother   . Diabetes Maternal Grandmother        father  . Cancer Son 15  . Colon cancer Son        Joslyn Hy - not biologically related  . Mental illness Son        bipolar  . Allergies Sister   . Breast cancer Paternal Aunt        2 paternal aunts with breast cancer postmenopausal  . Breast cancer Cousin        3 pat female first cousins with breast cancer  . Breast cancer Other        niece with breast cancer  . Colon cancer Maternal Uncle   . Prostate cancer Neg Hx   . Esophageal cancer Neg Hx   . Rectal cancer Neg Hx   . Stomach cancer Neg Hx     BP 110/80 (BP Location: Left Arm, Patient Position: Sitting, Cuff Size: Normal)   Pulse 85   Ht '5\' 8"'$  (1.727 m)   Wt 206 lb 9.6 oz (93.7 kg)   SpO2 100%   BMI 31.41 kg/m    Review of Systems     Objective:   Physical Exam VITAL SIGNS:  See vs page GENERAL: no distress Ext: no leg edema  Lab Results  Component Value Date  CREATININE 0.86 12/14/2019   BUN 10 12/14/2019   NA 136 12/14/2019   K 5.1  12/14/2019   CL 101 12/14/2019   CO2 29 12/14/2019       Assessment & Plan:  Transgender state: due for recheck of labs High-normal K+: This limits rx aldactone dosage  Patient Instructions  Blood tests are requested for you today.  We'll let you know about the results.   Here is some information about [facility]'s transgender services.  Please let me know if you want a referral.   Please come back for a follow-up appointment in 1 year.

## 2020-02-08 NOTE — Patient Instructions (Addendum)
Blood tests are requested for you today.  We'll let you know about the results.   Here is some information about [facility]'s transgender services.  Please let me know if you want a referral.   Please come back for a follow-up appointment in 1 year.

## 2020-02-11 LAB — TESTOSTERONE,FREE AND TOTAL
Testosterone, Free: 0.6 pg/mL — ABNORMAL LOW (ref 6.6–18.1)
Testosterone: <3 ng/dL — ABNORMAL LOW (ref 264–916)

## 2020-02-12 LAB — ESTRADIOL, FREE
Estradiol, Free: 3.16 pg/mL — ABNORMAL HIGH
Estradiol: 171 pg/mL — ABNORMAL HIGH

## 2020-02-16 ENCOUNTER — Other Ambulatory Visit: Payer: Self-pay | Admitting: Endocrinology

## 2020-02-16 ENCOUNTER — Encounter: Payer: Self-pay | Admitting: Endocrinology

## 2020-02-16 MED ORDER — ESTRADIOL 1 MG PO TABS: 3.0000 mg | ORAL_TABLET | Freq: Every day | ORAL | 3 refills | Status: DC

## 2020-03-03 ENCOUNTER — Telehealth: Payer: Self-pay

## 2020-03-03 NOTE — Telephone Encounter (Signed)
Letter written and given to patient

## 2020-03-03 NOTE — Telephone Encounter (Signed)
Patient came in states she startedelectrolysis  and the tech is needing a letter stating that the tech can treat the hair in the mole on her skin. Please advise if ok. 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

## 2020-03-03 NOTE — Telephone Encounter (Signed)
Yes please write her a letter stating she can have the hair in her mold treated with electrolysis and leave at front desk.

## 2020-03-16 ENCOUNTER — Other Ambulatory Visit: Payer: Self-pay | Admitting: Family Medicine

## 2020-06-01 ENCOUNTER — Other Ambulatory Visit: Payer: Self-pay | Admitting: Family Medicine

## 2020-06-01 DIAGNOSIS — E785 Hyperlipidemia, unspecified: Secondary | ICD-10-CM

## 2020-06-24 NOTE — Progress Notes (Signed)
Subjective:   Taylor Newman is a 69 y.o. female who presents for Medicare Annual (Subsequent) preventive examination.  Review of Systems     Cardiac Risk Factors include: advanced age (>14mn, >>49women);diabetes mellitus;dyslipidemia;hypertension     Objective:    Today's Vitals   06/27/20 0846  BP: 118/78  Pulse: 66  Temp: 98.3 F (36.8 C)  TempSrc: Temporal  SpO2: 98%  Weight: 204 lb (92.5 kg)  Height: 5' 8" (1.727 m)   Body mass index is 31.02 kg/m.  Advanced Directives 06/27/2020 11/18/2018 09/24/2017 04/30/2017 04/16/2017 02/11/2017  Does Patient Have a Medical Advance Directive? _0  No  Type of AParamedicof AFab[place]Living will [place]Living will HToa AltaLiving will HMcCool JunctionLiving will H[place]Living will -  Does patient want to make changes to medical advance directive? No - Patient declined - - - - No - Patient declined  Copy of HHomelandin Chart? Yes - validated most recent copy scanned in chart (See row information) Yes - validated most recent copy scanned in chart (See row information) Yes - - -    Current Medications (verified) Outpatient Encounter Medications as of 06/27/2020  Medication Sig   amLODipine (NORVASC) 5 MG tablet Take 1 tablet (5 mg total) by mouth 2 (two) times daily.   aspirin 81 MG chewable tablet Chew 81 mg by mouth daily.   atorvastatin (LIPITOR) 20 MG tablet Take 1 tablet (20 mg total) by mouth daily.   Biotin 5000 MCG TABS Take 1 tablet by mouth daily.   Cholecalciferol (VITAMIN D) 50 MCG (2000 UT) CAPS Take by mouth.   estradiol (ESTRACE) 1 MG tablet Take 3 tablets (3 mg total) by mouth daily.   Fluocinolone Acetonide 0.01 % OIL Place 5 drops in ear(s) 2 (two) times daily.   KRILL OIL PO Take 1 capsule by mouth daily.   lactobacillus acidophilus (BACID) TABS tablet Take 1 tablet by mouth  daily.   MELATONIN PO Take 3 mg by mouth at bedtime as needed.    Multiple Vitamin (MULTIVITAMIN) tablet Take 1 tablet by mouth daily.   psyllium (REGULOID) 0.52 G capsule Take 0.52 g by mouth daily.   spironolactone (ALDACTONE) 50 MG tablet TAKE 1 TABLET (50 MG TOTAL) BY MOUTH DAILY.   [DISCONTINUED] amLODipine (NORVASC) 5 MG tablet TAKE 1 TABLET (5 MG TOTAL) BY MOUTH 2 (TWO) TIMES DAILY.   No facility-administered encounter medications on file as of 06/27/2020.    Allergies (verified) Lisinopril   History: Past Medical History:  Diagnosis Date   Chest pain 01/12/2013   Dyspnea 05/20/2017   FHx: BRCA2 gene positive 02/20/2015   Sister with this and breast cancer.   Grief reaction 07/15/2013   Heart murmur    History of chicken pox    History of colon polyps    History of tobacco abuse 01/13/2014   Last cigarettes 05/30/2016   Hyperkalemia 02/11/2017   Hyperlipidemia    Hypertension    Laceration of hand 10/24/2014   Female-to-female transgender person 10/30/2016   Female-to-female transgender person 10/30/2016   [facility]   Sun-damaged skin 01/13/2014   Tobacco use disorder 01/13/2014   Past Surgical History:  Procedure Laterality Date   [facility]OSCOPY  2018   [facility]  Family History  Problem Relation Age of Onset   Colon cancer Father 3   Hyperlipidemia Father    Heart disease Father        stent placement   Hypertension Father    Cancer Father 68       colon with liver mets   Diabetes Father    Breast cancer Sister        BRCA2 positive   Cancer Sister        twice, breast cancer, BRCA 2 gene mutation   Hyperlipidemia Mother    Alzheimer's disease Mother    Hypertension Mother    Diabetes Maternal Grandmother        father   Cancer Son 57   Colon cancer Son        stepson - not biologically related    Mental illness Son        bipolar   Allergies Sister    Breast cancer Paternal Aunt        2 paternal aunts with breast cancer postmenopausal   Breast cancer Cousin        22 pat female first cousins with breast cancer   Breast cancer Other        niece with breast cancer   Colon cancer Maternal Uncle    Prostate cancer Neg Hx    Esophageal cancer Neg Hx    Rectal cancer Neg Hx    Stomach cancer Neg Hx    Social History   Socioeconomic History   Marital status: Married    Spouse name: Not on file   Number of children: Not on file   Years of education: Not on file   Highest education level: Not on file  Occupational History   Not on file  Tobacco Use   Smoking status: Former Smoker    Packs/day: 1.00    Years: 45.00    Pack years: 45.00    Types: Cigarettes    Quit date: 05/14/2016    Years since quitting: 4.1   Smokeless tobacco: Never Used   Tobacco comment: 1/2  ppd  Vaping Use   Vaping Use: Never used  Substance and Sexual Activity   Alcohol use: Yes    Alcohol/week: 12.0 standard drinks    Types: 12 Cans of beer per week    Comment: Drinks weekly   Drug use: No   Sexual activity: Not on file  Other Topics Concern   Not on file  Social History Narrative   Not on file   Social Determinants of Health   Financial Resource Strain: Low Risk    Difficulty of Paying Living Expenses: Not hard at all  Food Insecurity: No Food Insecurity   Worried About Charity fundraiser in the Last Year: Never true   [place] in the Last Year: Never true  Transportation Needs: No Transportation Needs   Lack of Transportation (Medical): No   Lack of Transportation (Non-Medical): No  Physical Activity:    Days of Exercise per Week:    Minutes of Exercise per Session:   Stress:    Feeling of Stress :   Social Connections:    Frequency of Communication with Friends and Family:    Frequency of Social Gatherings with Friends and Family:     Attends Religious Services:    Active Member of Clubs or Organizations:    Attends Archivist Meetings:    Marital Status:     Tobacco Counseling Counseling given: Not Answered Comment:  1/2  ppd   Clinical Intake: Pain : No/denies pain      Activities of Daily Living In your present state of health, do you have any difficulty performing the following activities: 06/27/2020  Hearing? N  Vision? N  Difficulty concentrating or making decisions? N  Walking or climbing stairs? N  Dressing or bathing? N  Doing errands, shopping? N  Preparing Food and eating ? N  Using the Toilet? N  In the past six months, have you accidently leaked urine? N  Do you have problems with loss of bowel control? N  Managing your Medications? N  Managing your Finances? N  Housekeeping or managing your Housekeeping? N  Some recent data might be hidden    Patient Care Team: Mosie Lukes, MD as PCP - General (Family Medicine) Linward Natal, MD as Consulting Physician (Obstetrics and Gynecology)  Indicate any recent Medical Services you may have received from other than [facility] providers in the past year (date may be approximate).     Assessment:   This is a routine wellness examination for Elberta Fortis.   Dietary issues and exercise activities discussed: Current Exercise Habits: The patient does not participate in regular exercise at present, Exercise limited by: None identified Diet (meal preparation, eat out, water intake, caffeinated beverages, dairy products, fruits and vegetables): well balanced   Goals     Increase physical activity     Reduce alcohol intake      Depression Screen PHQ 2/9 Scores 06/27/2020 11/18/2018 11/17/2018 09/24/2017 02/11/2017 10/30/2016 08/19/2015  PHQ - 2 Score 0 0 0 0 0 0 0    Fall Risk Fall Risk  06/27/2020 11/18/2018 11/17/2018 09/24/2017 02/11/2017  Falls in the past year? 0 0 0 No No  Number falls in past yr: 0 - - - -  Injury with Fall? 0 - -  - -  Follow up Education provided;Falls prevention discussed - - - -   Lives alone in ground floor apt.  Any stairs in or around the home? No  If so, are there any without handrails? No  Home free of loose throw rugs in walkways, pet beds, electrical cords, etc? No  Adequate lighting in your home to reduce risk of falls? No   Gait steady and fast without use of assistive device  Cognitive Function:        Immunizations Immunization History  Administered Date(s) Administered   Influenza,inj,Quad PF,6+ Mos 08/27/2019   Influenza-Unspecified 10/08/2013, 09/20/2014, 09/10/2015, 09/10/2016, 09/02/2017, 08/10/2018   PFIZER SARS-COV-2 Vaccination 12/28/2019, 01/18/2020   Pneumococcal Conjugate-13 02/11/2015, 10/30/2016   Pneumococcal Polysaccharide-23 11/17/2018   Td 05/10/2013   Tdap 11/03/2007   Zoster 09/25/2010    TDAP status: Up to date Flu Vaccine status: Up to date Pneumococcal vaccine status: Up to date Covid-19 vaccine status: Completed vaccines  Qualifies for Shingles Vaccine? Yes   Zostavax completed Yes   Shingrix Completed?: No.    Education has been provided regarding the importance of this vaccine. Patient has been advised to call insurance company to determine out of pocket expense if they have not yet received this vaccine. Advised may also receive vaccine at local pharmacy or Health Dept. Verbalized acceptance and understanding.  Screening Tests Health Maintenance  Topic Date Due   FOOT EXAM  Never done   OPHTHALMOLOGY EXAM  Never done   MAMMOGRAM  Never done   DEXA SCAN  Never done   URINE MICROALBUMIN  08/18/2016   HEMOGLOBIN A1C  06/12/2020   INFLUENZA VACCINE  07/10/2020   COLONOSCOPY  10/26/2022   TETANUS/TDAP  05/11/2023   COVID-19 Vaccine  Completed   Hepatitis C Screening  Completed   PNA vac Low Risk Adult  Completed    Health Maintenance  Health Maintenance Due  Topic Date Due   FOOT EXAM  Never done    OPHTHALMOLOGY EXAM  Never done   MAMMOGRAM  Never done   DEXA SCAN  Never done   URINE MICROALBUMIN  08/18/2016   HEMOGLOBIN A1C  06/12/2020    Colorectal cancer screening: Completed 10/27/19. Repeat every 3 years   Lung Cancer Screening: (Low Dose CT Chest recommended if Age 29-80 years, 30 pack-year currently smoking OR have quit w/in 15years.) does qualify.   Lung Cancer Screening Referral: Pt declines at this time.  Additional Screening:   Vision Screening: Recommended annual ophthalmology exams for early detection of glaucoma and other disorders of the eye. Is the patient up to date with their annual eye exam?  Yes  Who is the provider or what is the name of the office in which the patient attends annual eye exams? Dr.Becky Clark at the [facility] Scre[facility]: Recommended annual dental exams for proper oral hygiene  Community Resource Referral / Chronic Care Management: CRR required this visit?  No   CCM required this visit?  No      Plan:    Please schedule your next medicare wellness visit with me in 1 yr.  Continue to eat heart healthy diet (full of fruits, vegetables, whole grains, lean protein, water--limit salt, fat, and sugar intake) and increase physical activity as tolerated.  Continue doing brain stimulating activities (puzzles, reading, adult coloring books, staying active) to keep memory sharp.     I have personally reviewed and noted the following in the patients chart:    Medical and social history  Use of alcohol, tobacco or illicit drugs   Current medications and supplements  Functional ability and status  Nutritional status  Physical activity  Advanced directives  List of other physicians  Hospitalizations, surgeries, and ER visits in previous 12 months  Vitals  Screenings to include cognitive, depression, and falls  Referrals and appointments  In addition, I have reviewed and discussed with patient certain  preventive protocols, quality metrics, and best practice recommendations. A written personalized care plan for preventive services as well as general preventive health recommendations were provided to patient.     Shela Nevin, [place]   06/27/2020

## 2020-06-27 ENCOUNTER — Ambulatory Visit (INDEPENDENT_AMBULATORY_CARE_PROVIDER_SITE_OTHER): Payer: PPO | Admitting: *Deleted

## 2020-06-27 ENCOUNTER — Encounter: Payer: Self-pay | Admitting: *Deleted

## 2020-06-27 ENCOUNTER — Other Ambulatory Visit (HOSPITAL_COMMUNITY): Payer: Self-pay | Admitting: Internal Medicine

## 2020-06-27 ENCOUNTER — Encounter: Payer: Self-pay | Admitting: Family Medicine

## 2020-06-27 ENCOUNTER — Ambulatory Visit (INDEPENDENT_AMBULATORY_CARE_PROVIDER_SITE_OTHER): Payer: PPO | Admitting: Family Medicine

## 2020-06-27 ENCOUNTER — Other Ambulatory Visit: Payer: Self-pay

## 2020-06-27 VITALS — BP 118/78 | HR 66 | Temp 98.3°F | Ht 68.0 in | Wt 204.0 lb

## 2020-06-27 DIAGNOSIS — I10 Essential (primary) hypertension: Secondary | ICD-10-CM

## 2020-06-27 DIAGNOSIS — Z0000 Encounter for general adult medical examination without abnormal findings: Secondary | ICD-10-CM | POA: Diagnosis not present

## 2020-06-27 DIAGNOSIS — E119 Type 2 diabetes mellitus without complications: Secondary | ICD-10-CM

## 2020-06-27 DIAGNOSIS — F640 Transsexualism: Secondary | ICD-10-CM | POA: Diagnosis not present

## 2020-06-27 DIAGNOSIS — E782 Mixed hyperlipidemia: Secondary | ICD-10-CM

## 2020-06-27 DIAGNOSIS — Z789 Other specified health status: Secondary | ICD-10-CM

## 2020-06-27 LAB — COMPREHENSIVE METABOLIC PANEL
ALT: 25 U/L (ref 0–53)
AST: 17 U/L (ref 0–37)
Albumin: 4.3 g/dL (ref 3.5–5.2)
Alkaline Phosphatase: 77 U/L (ref 39–117)
BUN: 14 mg/dL (ref 6–23)
CO2: 29 mEq/L (ref 19–32)
Calcium: 9.6 mg/dL (ref 8.4–10.5)
Chloride: 103 mEq/L (ref 96–112)
Creatinine, Ser: 0.85 mg/dL (ref 0.40–1.50)
GFR: 88.95 mL/min (ref >60.00)
Glucose, Bld: 120 mg/dL — ABNORMAL HIGH (ref 70–99)
Potassium: 4.9 mEq/L (ref 3.5–5.1)
Sodium: 137 mEq/L (ref 135–145)
Total Bilirubin: 0.4 mg/dL (ref 0.2–1.2)
Total Protein: 6.5 g/dL (ref 6.0–8.3)

## 2020-06-27 LAB — CBC
HCT: 42.2 % (ref 39.0–52.0)
Hemoglobin: 14.5 g/dL (ref 13.0–17.0)
MCHC: 34.4 g/dL (ref 30.0–36.0)
MCV: 96.2 fl (ref 78.0–100.0)
Platelets: 294 K/uL (ref 150.0–400.0)
RBC: 4.39 Mil/uL (ref 4.22–5.81)
RDW: 13 % (ref 11.5–15.5)
WBC: 9.3 K/uL (ref 4.0–10.5)

## 2020-06-27 LAB — LIPID PANEL
Cholesterol: 184 mg/dL (ref 0–200)
HDL: 59.8 mg/dL (ref >39.00)
LDL Cholesterol: 102 mg/dL — ABNORMAL HIGH (ref 0–99)
NonHDL: 123.93
Total CHOL/HDL Ratio: 3
Triglycerides: 109 mg/dL (ref 0.0–149.0)
VLDL: 21.8 mg/dL (ref 0.0–40.0)

## 2020-06-27 LAB — HEMOGLOBIN A1C: Hgb A1c MFr Bld: 6.3 % (ref 4.6–6.5)

## 2020-06-27 LAB — TSH: TSH: 3.58 u[IU]/mL (ref 0.35–4.50)

## 2020-06-27 MED ORDER — AMLODIPINE BESYLATE 5 MG PO TABS: 5.0000 mg | ORAL_TABLET | Freq: Two times a day (BID) | ORAL | 1 refills | Status: DC

## 2020-06-27 NOTE — Assessment & Plan Note (Signed)
Following with Endocrinology and doing well.

## 2020-06-27 NOTE — Assessment & Plan Note (Signed)
Tolerating statin, encouraged heart healthy diet, avoid trans fats, minimize simple carbs and saturated fats. Increase exercise as tolerated 

## 2020-06-27 NOTE — Patient Instructions (Signed)

## 2020-06-27 NOTE — Assessment & Plan Note (Signed)
hgba1c acceptable, minimize simple carbs. Increase exercise as tolerated. Continue current meds 

## 2020-06-27 NOTE — Patient Instructions (Signed)
Please schedule your next medicare wellness visit with me in 1 yr.  Continue to eat heart healthy diet (full of fruits, vegetables, whole grains, lean protein, water--limit salt, fat, and sugar intake) and increase physical activity as tolerated.  Continue doing brain stimulating activities (puzzles, reading, adult coloring books, staying active) to keep memory sharp.    Taylor Newman , Thank you for taking time to come for your Medicare Wellness Visit. I appreciate your ongoing commitment to your health goals. Please review the following plan we discussed and let me know if I can assist you in the future.   These are the goals we discussed: Goals    . Increase physical activity    . Reduce alcohol intake       This is a list of the screening recommended for you and due dates:  Health Maintenance  Topic Date Due  . Complete foot exam   Never done  . Eye exam for diabetics  Never done  . Mammogram  Never done  . DEXA scan (bone density measurement)  Never done  . Urine Protein Check  08/18/2016  . Hemoglobin A1C  06/12/2020  . Flu Shot  07/10/2020  . Colon Cancer Screening  10/26/2022  . Tetanus Vaccine  05/11/2023  . COVID-19 Vaccine  Completed  .  Hepatitis C: One time screening is recommended by Center for Disease Control  (CDC) for  adults born from [birth year] through 1965.   Completed  . Pneumonia vaccines  Completed    Preventive Care 32 Years and Older, Female Preventive care refers to lifestyle choices and visits with your health care provider that can promote health and wellness. This includes:  A yearly physical exam. This is also called an annual well check.  Regular dental and eye exams.  Immunizations.  Screening for certain conditions.  Healthy lifestyle choices, such as diet and exercise. What can I expect for my preventive care visit? Physical exam Your health care provider will check:  Height and weight. These may be used to calculate body mass index (BMI),  which is a measurement that tells if you are at a healthy weight.  Heart rate and blood pressure.  Your skin for abnormal spots. Counseling Your health care provider may ask you questions about:  Alcohol, tobacco, and drug use.  Emotional well-being.  Home and relationship well-being.  Sexual activity.  Eating habits.  History of falls.  Memory and ability to understand (cognition).  Work and work Statistician.  Pregnancy and menstrual history. What immunizations do I need?  Influenza (flu) vaccine  This is recommended every year. Tetanus, diphtheria, and pertussis (Tdap) vaccine  You may need a Td booster every 10 years. Varicella (chickenpox) vaccine  You may need this vaccine if you have not already been vaccinated. Zoster (shingles) vaccine  You may need this after age 59. Pneumococcal conjugate (PCV13) vaccine  One dose is recommended after age 63. Pneumococcal polysaccharide (PPSV23) vaccine  One dose is recommended after age 12. Measles, mumps, and rubella (MMR) vaccine  You may need at least one dose of MMR if you were born in 1957 or later. You may also need a second dose. Meningococcal conjugate (MenACWY) vaccine  You may need this if you have certain conditions. Hepatitis A vaccine  You may need this if you have certain conditions or if you travel or work in places where you may be exposed to hepatitis A. Hepatitis B vaccine  You may need this if you have certain conditions  or if you travel or work in places where you may be exposed to hepatitis B. Haemophilus influenzae type b (Hib) vaccine  You may need this if you have certain conditions. You may receive vaccines as individual doses or as more than one vaccine together in one shot (combination vaccines). Talk with your health care provider about the risks and benefits of combination vaccines. What tests do I need? Blood tests  Lipid and cholesterol levels. These may be checked every 5  years, or more frequently depending on your overall health.  Hepatitis C test.  Hepatitis B test. Screening  Lung cancer screening. You may have this screening every year starting at age 52 if you have a 30-pack-year history of smoking and currently smoke or have quit within the past 15 years.  Colorectal cancer screening. All adults should have this screening starting at age 40 and continuing until age 60. Your health care provider may recommend screening at age 74 if you are at increased risk. You will have tests every 1-10 years, depending on your results and the type of screening test.  Diabetes screening. This is done by checking your blood sugar (glucose) after you have not eaten for a while (fasting). You may have this done every 1-3 years.  Mammogram. This may be done every 1-2 years. Talk with your health care provider about how often you should have regular mammograms.  BRCA-related cancer screening. This may be done if you have a family history of breast, ovarian, tubal, or peritoneal cancers. Other tests  Sexually transmitted disease (STD) testing.  Bone density scan. This is done to screen for osteoporosis. You may have this done starting at age 70. Follow these instructions at home: Eating and drinking  Eat a diet that includes fresh fruits and vegetables, whole grains, lean protein, and low-fat dairy products. Limit your intake of foods with high amounts of sugar, saturated fats, and salt.  Take vitamin and mineral supplements as recommended by your health care provider.  Do not drink alcohol if your health care provider tells you not to drink.  If you drink alcohol: ? Limit how much you have to 0-1 drink a day. ? Be aware of how much alcohol is in your drink. In the [place] one drink equals one 12 oz bottle of beer (355 mL), one 5 oz glass of wine (148 mL), or one 1 oz glass of hard liquor (44 mL). Lifestyle  Take daily care of your teeth and gums.  Stay active.  Exercise for at least 30 minutes on 5 or more days each week.  Do not use any products that contain nicotine or tobacco, such as cigarettes, e-cigarettes, and chewing tobacco. If you need help quitting, ask your health care provider.  If you are sexually active, practice safe sex. Use a condom or other form of protection in order to prevent STIs (sexually transmitted infections).  Talk with your health care provider about taking a low-dose aspirin or statin. What's next?  Go to your health care provider once a year for a well check visit.  Ask your health care provider how often you should have your eyes and teeth checked.  Stay up to date on all vaccines. This information is not intended to replace advice given to you by your health care provider. Make sure you discuss any questions you have with your health care provider. Document Revised: 11/20/2018 Document Reviewed: 11/20/2018 Elsevier Patient Education  2020 [employer].

## 2020-06-27 NOTE — Assessment & Plan Note (Signed)
Well controlled, no changes to meds. Encouraged heart healthy diet such as the DASH diet and exercise as tolerated.  °

## 2020-06-27 NOTE — Progress Notes (Signed)
Subjective:    Patient ID: Taylor Newman, adult    DOB: 01-04-1951, 69 y.o.   MRN: [id number]  Chief Complaint  Patient presents with  . Follow-up    HPI Patient is in today for follow up on chronic medical concerns. No recent febrile illness or hospitalizations. 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 Denies CP/palp/SOB/HA/congestion/fevers/GI or GU c/o. Taking meds as prescribed. Estradiol has been increased from 2 to 3 mg daily. Notes improvement in skin and hair.   Past Medical History:  Diagnosis Date  . Chest pain 01/12/2013  . Dyspnea 05/20/2017  . FHx: BRCA2 gene positive 02/20/2015   Sister with this and breast cancer.  . Grief reaction 07/15/2013  . Heart murmur   . History of chicken pox   . History of colon polyps   . History of tobacco abuse 01/13/2014   Last cigarettes 05/30/2016  . Hyperkalemia 02/11/2017  . Hyperlipidemia   . Hypertension   . Laceration of hand 10/24/2014  . Female-to-female transgender person 10/30/2016  . Female-to-female transgender person 10/30/2016   Sees endocrinology Dr Linward Natal at [facility] in [place]  . Preventative health care 08/13/2014  . Sun-damaged skin 01/13/2014  . Tobacco use disorder 01/13/2014    Past Surgical History:  Procedure Laterality Date  . COLONOSCOPY  2018  . POLYPECTOMY    . TONSILLECTOMY  1954    Family History  Problem Relation Age of Onset  . Colon cancer Father 27  . Hyperlipidemia Father   . Heart disease Father        stent placement  . Hypertension Father   . Cancer Father 71       colon with liver mets  . Diabetes Father   . Breast cancer Sister        BRCA2 positive  . Cancer Sister        twice, breast cancer, BRCA 2 gene mutation  . Hyperlipidemia Mother   . Alzheimer's disease Mother   . Hypertension Mother   . Diabetes Maternal Grandmother        father  . Cancer Son 81  . Colon cancer Son         Joslyn Hy - not biologically related  . Mental illness Son        bipolar  . Allergies Sister   . Breast cancer Paternal Aunt        2 paternal aunts with breast cancer postmenopausal  . Breast cancer Cousin        3 pat female first cousins with breast cancer  . Breast cancer Other        niece with breast cancer  . Colon cancer Maternal Uncle   . Prostate cancer Neg Hx   . Esophageal cancer Neg Hx   . Rectal cancer Neg Hx   . Stomach cancer Neg Hx     Social History   Socioeconomic History  . Marital status: Married    Spouse name: Not on file  . Number of children: Not on file  . Years of education: Not on file  . Highest education level: Not on file  Occupational History  . Not on file  Tobacco Use  . Smoking status: Former Smoker    Packs/day: 1.00    Years: 45.00    Pack years: 45.00    Types: Cigarettes    Quit date: 05/14/2016    Years  since quitting: 4.1  . Smokeless tobacco: Never Used  . Tobacco comment: 1/2  ppd  Vaping Use  . Vaping Use: Never used  Substance and Sexual Activity  . Alcohol use: Yes    Alcohol/week: 12.0 standard drinks    Types: 12 Cans of beer per week    Comment: Drinks weekly  . Drug use: No  . Sexual activity: Not on file  Other Topics Concern  . Not on file  Social History Narrative  . Not on file   Social Determinants of Health   Financial Resource Strain:   . Difficulty of Paying Living Expenses:   Food Insecurity:   . Worried About Charity fundraiser in the Last Year:   . Arboriculturist in the Last Year:   Transportation Needs:   . Film/video editor (Medical):   Marland Kitchen Lack of Transportation (Non-Medical):   Physical Activity:   . Days of Exercise per Week:   . Minutes of Exercise per Session:   Stress:   . Feeling of Stress :   Social Connections:   . Frequency of Communication with Friends and Family:   . Frequency of Social Gatherings with Friends and Family:   . Attends Religious Services:   . Active Member  of Clubs or Organizations:   . Attends Archivist Meetings:   Marland Kitchen Marital Status:   Intimate Partner Violence:   . Fear of Current or Ex-Partner:   . Emotionally Abused:   Marland Kitchen Physically Abused:   . Sexually Abused:     Outpatient Medications Prior to Visit  Medication Sig Dispense Refill  . aspirin 81 MG chewable tablet Chew 81 mg by mouth daily.    Marland Kitchen atorvastatin (LIPITOR) 20 MG tablet Take 1 tablet (20 mg total) by mouth daily. 90 tablet 0  . Biotin 5000 MCG TABS Take 1 tablet by mouth daily.    Marland Kitchen estradiol (ESTRACE) 1 MG tablet Take 3 tablets (3 mg total) by mouth daily. 270 tablet 3  . Fluocinolone Acetonide 0.01 % OIL Place 5 drops in ear(s) 2 (two) times daily. 20 mL 1  . KRILL OIL PO Take 1 capsule by mouth daily.    Marland Kitchen lactobacillus acidophilus (BACID) TABS tablet Take 1 tablet by mouth daily.    Marland Kitchen MELATONIN PO Take by mouth at bedtime as needed.    . Multiple Vitamin (MULTIVITAMIN) tablet Take 1 tablet by mouth daily.    . psyllium (REGULOID) 0.52 G capsule Take 0.52 g by mouth daily.    Marland Kitchen spironolactone (ALDACTONE) 50 MG tablet TAKE 1 TABLET (50 MG TOTAL) BY MOUTH DAILY. 90 tablet PRN  . amLODipine (NORVASC) 5 MG tablet TAKE 1 TABLET (5 MG TOTAL) BY MOUTH 2 (TWO) TIMES DAILY. 180 tablet 1   No facility-administered medications prior to visit.    Allergies  Allergen Reactions  . Lisinopril Cough    hoarseness    Review of Systems  Constitutional: Negative for fever and malaise/fatigue.  HENT: Negative for congestion.   Eyes: Negative for blurred vision.  Respiratory: Negative for shortness of breath.   Cardiovascular: Negative for chest pain, palpitations and leg swelling.  Gastrointestinal: Negative for abdominal pain, blood in stool and nausea.  Genitourinary: Negative for dysuria and frequency.  Musculoskeletal: Negative for falls.  Skin: Negative for rash.  Neurological: Negative for dizziness, loss of consciousness and headaches.  Endo/Heme/Allergies:  Negative for environmental allergies.  Psychiatric/Behavioral: Negative for depression. The patient is not nervous/anxious.  Objective:    Physical Exam Vitals and nursing note reviewed.  Constitutional:      General: She is not in acute distress.    Appearance: She is well-developed.  HENT:     Head: Normocephalic and atraumatic.     Nose: Nose normal.  Eyes:     General:        Right eye: No discharge.        Left eye: No discharge.  Cardiovascular:     Rate and Rhythm: Normal rate and regular rhythm.     Heart sounds: No murmur heard.   Pulmonary:     Effort: Pulmonary effort is normal.     Breath sounds: Normal breath sounds.  Abdominal:     General: Bowel sounds are normal.     Palpations: Abdomen is soft.     Tenderness: There is no abdominal tenderness.  Musculoskeletal:     Cervical back: Normal range of motion and neck supple.  Skin:    General: Skin is warm and dry.  Neurological:     Mental Status: She is alert and oriented to person, place, and time.     BP 118/78 (BP Location: Right Arm, Cuff Size: Large)   Pulse 66   Temp 98.3 F (36.8 C) (Temporal)   Resp 12   Ht 5' 8" (1.727 m)   Wt 204 lb 6.4 oz (92.7 kg)   SpO2 98%   BMI 31.08 kg/m  Wt Readings from Last 3 Encounters:  06/27/20 204 lb 6.4 oz (92.7 kg)  02/08/20 206 lb 9.6 oz (93.7 kg)  12/14/19 211 lb (95.7 kg)    Diabetic Foot Exam - Simple   No data filed     Lab Results  Component Value Date   WBC 10.1 12/14/2019   HGB 14.9 12/14/2019   HCT 43.8 12/14/2019   PLT 299.0 12/14/2019   GLUCOSE 110 (H) 12/14/2019   CHOL 207 (H) 12/14/2019   TRIG 156.0 (H) 12/14/2019   HDL 59.50 12/14/2019   LDLCALC 116 (H) 12/14/2019   ALT 35 12/14/2019   AST 25 12/14/2019   NA 136 12/14/2019   K 5.1 12/14/2019   CL 101 12/14/2019   CREATININE 0.86 12/14/2019   BUN 10 12/14/2019   CO2 29 12/14/2019   TSH 4.00 12/14/2019   PSA 0.02 (L) 11/17/2018   HGBA1C 6.8 (H) 12/14/2019    MICROALBUR <0.7 08/19/2015    Lab Results  Component Value Date   TSH 4.00 12/14/2019   Lab Results  Component Value Date   WBC 10.1 12/14/2019   HGB 14.9 12/14/2019   HCT 43.8 12/14/2019   MCV 97.4 12/14/2019   PLT 299.0 12/14/2019   Lab Results  Component Value Date   NA 136 12/14/2019   K 5.1 12/14/2019   CO2 29 12/14/2019   GLUCOSE 110 (H) 12/14/2019   BUN 10 12/14/2019   CREATININE 0.86 12/14/2019   BILITOT 0.5 12/14/2019   ALKPHOS 79 12/14/2019   AST 25 12/14/2019   ALT 35 12/14/2019   PROT 6.8 12/14/2019   ALBUMIN 4.4 12/14/2019   CALCIUM 10.2 12/14/2019   GFR 87.89 12/14/2019   Lab Results  Component Value Date   CHOL 207 (H) 12/14/2019   Lab Results  Component Value Date   HDL 59.50 12/14/2019   Lab Results  Component Value Date   LDLCALC 116 (H) 12/14/2019   Lab Results  Component Value Date   TRIG 156.0 (H) 12/14/2019   Lab Results  Component  Value Date   CHOLHDL 3 12/14/2019   Lab Results  Component Value Date   HGBA1C 6.8 (H) 12/14/2019       Assessment & Plan:   Problem List Items Addressed This Visit    Hyperlipidemia    Tolerating statin, encouraged heart healthy diet, avoid trans fats, minimize simple carbs and saturated fats. Increase exercise as tolerated      HTN (hypertension)    Well controlled, no changes to meds. Encouraged heart healthy diet such as the DASH diet and exercise as tolerated.       Diabetes mellitus without complication (HCC)    [id number] acceptable, minimize simple carbs. Increase exercise as tolerated. Continue current meds      Female-to-female transgender person    Following with Endocrinology and doing well.          I am having Braxton Feathers "Chelsae" maintain her aspirin, KRILL OIL PO, lactobacillus acidophilus, multivitamin, psyllium, MELATONIN PO, Biotin, Fluocinolone Acetonide, spironolactone, estradiol, amLODipine, and atorvastatin.  No orders of the defined types were placed in this  encounter.    Penni Homans, MD

## 2020-07-05 ENCOUNTER — Encounter: Payer: Self-pay | Admitting: *Deleted

## 2020-08-04 ENCOUNTER — Encounter: Payer: Self-pay | Admitting: Family Medicine

## 2020-09-01 ENCOUNTER — Other Ambulatory Visit: Payer: Self-pay | Admitting: Family Medicine

## 2020-09-01 DIAGNOSIS — E785 Hyperlipidemia, unspecified: Secondary | ICD-10-CM

## 2020-09-09 ENCOUNTER — Ambulatory Visit: Payer: PPO | Attending: Internal Medicine

## 2020-09-09 ENCOUNTER — Other Ambulatory Visit (HOSPITAL_COMMUNITY): Payer: Self-pay | Admitting: Internal Medicine

## 2020-09-09 DIAGNOSIS — Z23 Encounter for immunization: Secondary | ICD-10-CM

## 2020-09-09 NOTE — Progress Notes (Signed)
   Covid-19 Vaccination Clinic  Name:  Taylor Newman    MRN: [id number] DOB: 1951-08-09  09/09/2020  Taylor Newman was observed post Covid-19 immunization for 15 minutes without incident. She was provided with Vaccine Information Sheet and instruction to access the V-Safe system. Vaccinated By: Hoover Brunette  Taylor Newman was instructed to call 911 with any severe reactions post vaccine: Marland Kitchen Difficulty breathing  . Swelling of face and throat  . A fast heartbeat  . A bad rash all over body  . Dizziness and weakness

## 2020-09-12 ENCOUNTER — Ambulatory Visit: Payer: PPO

## 2020-12-05 ENCOUNTER — Other Ambulatory Visit: Payer: Self-pay | Admitting: Endocrinology

## 2020-12-05 ENCOUNTER — Other Ambulatory Visit: Payer: Self-pay | Admitting: Family Medicine

## 2020-12-05 DIAGNOSIS — E785 Hyperlipidemia, unspecified: Secondary | ICD-10-CM

## 2021-01-09 ENCOUNTER — Encounter: Payer: PPO | Admitting: Family Medicine

## 2021-01-10 ENCOUNTER — Ambulatory Visit (INDEPENDENT_AMBULATORY_CARE_PROVIDER_SITE_OTHER): Payer: PPO | Admitting: Family Medicine

## 2021-01-10 ENCOUNTER — Other Ambulatory Visit: Payer: Self-pay

## 2021-01-10 VITALS — BP 124/82 | HR 67 | Temp 98.3°F | Resp 16 | Ht 68.0 in | Wt 206.6 lb

## 2021-01-10 DIAGNOSIS — I10 Essential (primary) hypertension: Secondary | ICD-10-CM

## 2021-01-10 DIAGNOSIS — Z0000 Encounter for general adult medical examination without abnormal findings: Secondary | ICD-10-CM | POA: Diagnosis not present

## 2021-01-10 DIAGNOSIS — E119 Type 2 diabetes mellitus without complications: Secondary | ICD-10-CM

## 2021-01-10 DIAGNOSIS — Z789 Other specified health status: Secondary | ICD-10-CM

## 2021-01-10 DIAGNOSIS — E782 Mixed hyperlipidemia: Secondary | ICD-10-CM

## 2021-01-10 DIAGNOSIS — Z8481 Family history of carrier of genetic disease: Secondary | ICD-10-CM

## 2021-01-10 DIAGNOSIS — Z86010 Personal history of colonic polyps: Secondary | ICD-10-CM

## 2021-01-10 DIAGNOSIS — R350 Frequency of micturition: Secondary | ICD-10-CM

## 2021-01-10 LAB — CBC
HCT: 41.6 % (ref 36.0–46.0)
Hemoglobin: 14.3 g/dL (ref 12.0–15.0)
MCHC: 34.4 g/dL (ref 30.0–36.0)
MCV: 95.8 fl (ref 78.0–100.0)
Platelets: 297 10*3/uL (ref 150.0–400.0)
RBC: 4.35 Mil/uL (ref 3.87–5.11)
RDW: 12.6 % (ref 11.5–15.5)
WBC: 9.4 10*3/uL (ref 4.0–10.5)

## 2021-01-10 LAB — MICROALBUMIN / CREATININE URINE RATIO
Creatinine,U: 62.6 mg/dL
Microalb Creat Ratio: 1.1 mg/g (ref 0.0–30.0)
Microalb, Ur: <0.7 mg/dL (ref 0.0–1.9)

## 2021-01-10 LAB — COMPREHENSIVE METABOLIC PANEL WITH GFR
ALT: 26 U/L (ref 0–35)
AST: 22 U/L (ref 0–37)
Albumin: 4.2 g/dL (ref 3.5–5.2)
Alkaline Phosphatase: 62 U/L (ref 39–117)
BUN: 12 mg/dL (ref 6–23)
CO2: 29 meq/L (ref 19–32)
Calcium: 9.3 mg/dL (ref 8.4–10.5)
Chloride: 101 meq/L (ref 96–112)
Creatinine, Ser: 0.79 mg/dL (ref 0.40–1.20)
GFR: 75.39 mL/min
Glucose, Bld: 111 mg/dL — ABNORMAL HIGH (ref 70–99)
Potassium: 4.3 meq/L (ref 3.5–5.1)
Sodium: 135 meq/L (ref 135–145)
Total Bilirubin: 0.7 mg/dL (ref 0.2–1.2)
Total Protein: 6.4 g/dL (ref 6.0–8.3)

## 2021-01-10 LAB — LIPID PANEL
Cholesterol: 178 mg/dL (ref 0–200)
HDL: 59.2 mg/dL (ref >39.00)
LDL Cholesterol: 87 mg/dL (ref 0–99)
NonHDL: 118.7
Total CHOL/HDL Ratio: 3
Triglycerides: 160 mg/dL — ABNORMAL HIGH (ref 0.0–149.0)
VLDL: 32 mg/dL (ref 0.0–40.0)

## 2021-01-10 LAB — TSH: TSH: 4.49 u[IU]/mL (ref 0.35–4.50)

## 2021-01-10 LAB — HEMOGLOBIN A1C: Hgb A1c MFr Bld: 6.6 % — ABNORMAL HIGH (ref 4.6–6.5)

## 2021-01-10 LAB — PSA: PSA: 0 ng/mL — ABNORMAL LOW (ref 0.10–4.00)

## 2021-01-10 NOTE — Assessment & Plan Note (Signed)
Patient encouraged to maintain heart healthy diet, regular exercise, adequate sleep. Consider daily probiotics. Take medications as prescribed. Labs reviewed and ordered 

## 2021-01-10 NOTE — Assessment & Plan Note (Signed)
Check urine microalb and PSA today

## 2021-01-10 NOTE — Patient Instructions (Signed)
Preventive Care 61 Years and Older, Female Preventive care refers to lifestyle choices and visits with your health care provider that can promote health and wellness. This includes:  A yearly physical exam. This is also called an annual wellness visit.  Regular dental and eye exams.  Immunizations.  Screening for certain conditions.  Healthy lifestyle choices, such as: ? Eating a healthy diet. ? Getting regular exercise. ? Not using drugs or products that contain nicotine and tobacco. ? Limiting alcohol use. What can I expect for my preventive care visit? Physical exam Your health care provider will check your:  Height and weight. These may be used to calculate your BMI (body mass index). BMI is a measurement that tells if you are at a healthy weight.  Heart rate and blood pressure.  Body temperature.  Skin for abnormal spots. Counseling Your health care provider may ask you questions about your:  Past medical problems.  Family's medical history.  Alcohol, tobacco, and drug use.  Emotional well-being.  Home life and relationship well-being.  Sexual activity.  Diet, exercise, and sleep habits.  History of falls.  Memory and ability to understand (cognition).  Work and work Statistician.  Pregnancy and menstrual history.  Access to firearms. What immunizations do I need? Vaccines are usually given at various ages, according to a schedule. Your health care provider will recommend vaccines for you based on your age, medical history, and lifestyle or other factors, such as travel or where you work.   What tests do I need? Blood tests  Lipid and cholesterol levels. These may be checked every 5 years, or more often depending on your overall health.  Hepatitis C test.  Hepatitis B test. Screening  Lung cancer screening. You may have this screening every year starting at age 74 if you have a 30-pack-year history of smoking and currently smoke or have quit within  the past 15 years.  Colorectal cancer screening. ? All adults should have this screening starting at age 44 and continuing until age 58. ? Your health care provider may recommend screening at age 2 if you are at increased risk. ? You will have tests every 1-10 years, depending on your results and the type of screening test.  Diabetes screening. ? This is done by checking your blood sugar (glucose) after you have not eaten for a while (fasting). ? You may have this done every 1-3 years.  Mammogram. ? This may be done every 1-2 years. ? Talk with your health care provider about how often you should have regular mammograms.  Abdominal aortic aneurysm (AAA) screening. You may need this if you are a current or former smoker.  BRCA-related cancer screening. This may be done if you have a family history of breast, ovarian, tubal, or peritoneal cancers. Other tests  STD (sexually transmitted disease) testing, if you are at risk.  Bone density scan. This is done to screen for osteoporosis. You may have this done starting at age 104. Talk with your health care provider about your test results, treatment options, and if necessary, the need for more tests. Follow these instructions at home: Eating and drinking  Eat a diet that includes fresh fruits and vegetables, whole grains, lean protein, and low-fat dairy products. Limit your intake of foods with high amounts of sugar, saturated fats, and salt.  Take vitamin and mineral supplements as recommended by your health care provider.  Do not drink alcohol if your health care provider tells you not to drink.  If you drink alcohol: ? Limit how much you have to 0-1 drink a day. ? Be aware of how much alcohol is in your drink. In the [place] one drink equals one 12 oz bottle of beer (355 mL), one 5 oz glass of wine (148 mL), or one 1 oz glass of hard liquor (44 mL).   Lifestyle  Take daily care of your teeth and gums. Brush your teeth every morning  and night with fluoride toothpaste. Floss one time each day.  Stay active. Exercise for at least 30 minutes 5 or more days each week.  Do not use any products that contain nicotine or tobacco, such as cigarettes, e-cigarettes, and chewing tobacco. If you need help quitting, ask your health care provider.  Do not use drugs.  If you are sexually active, practice safe sex. Use a condom or other form of protection in order to prevent STIs (sexually transmitted infections).  Talk with your health care provider about taking a low-dose aspirin or statin.  Find healthy ways to cope with stress, such as: ? Meditation, yoga, or listening to music. ? Journaling. ? Talking to a trusted person. ? Spending time with friends and family. Safety  Always wear your seat belt while driving or riding in a vehicle.  Do not drive: ? If you have been drinking alcohol. Do not ride with someone who has been drinking. ? When you are tired or distracted. ? While texting.  Wear a helmet and other protective equipment during sports activities.  If you have firearms in your house, make sure you follow all gun safety procedures. What's next?  Visit your health care provider once a year for an annual wellness visit.  Ask your health care provider how often you should have your eyes and teeth checked.  Stay up to date on all vaccines. This information is not intended to replace advice given to you by your health care provider. Make sure you discuss any questions you have with your health care provider. Document Revised: 11/16/2020 Document Reviewed: 11/20/2018 Elsevier Patient Education  2021 [employer]  

## 2021-01-10 NOTE — Assessment & Plan Note (Signed)
Well controlled, no changes to meds. Encouraged heart healthy diet such as the DASH diet and exercise as tolerated.  °

## 2021-01-10 NOTE — Assessment & Plan Note (Signed)
Doing well working with endocrinology

## 2021-01-10 NOTE — Assessment & Plan Note (Signed)
hgba1c acceptable, minimize simple carbs. Increase exercise as tolerated. Continue current meds 

## 2021-01-10 NOTE — Progress Notes (Signed)
Patient ID: Taylor Newman, adult   DOB: January 11, 1951, 70 y.o.   MRN: [id number]   Subjective:    Patient ID: Taylor Newman, adult    DOB: 06/24/1951, 70 y.o.   MRN: [id number]  Chief Complaint  Patient presents with  . Annual Exam    HPI Patient is in today for annual preventative exam and follow up exam on chronic medical concerns. No recent febrile illness or hospitalizations. No polydipsia. Some urinary frequency. Is doing well stays active and works as a Engineer, technical sales. Tries to maintain a heart healthy diet. Denies CP/palp/SOB/HA/congestion/fevers/GI or GU c/o. Taking meds as prescribed. Continues to follow with endocrinology and is doing well.   Past Medical History:  Diagnosis Date  . Chest pain 01/12/2013  . Dyspnea 05/20/2017  . FHx: BRCA2 gene positive 02/20/2015   Sister with this and breast cancer.  . Grief reaction 07/15/2013  . Heart murmur   . History of chicken pox   . History of colon polyps   . History of tobacco abuse 01/13/2014   Last cigarettes 05/30/2016  . Hyperkalemia 02/11/2017  . Hyperlipidemia   . Hypertension   . Laceration of hand 10/24/2014  . Female-to-female transgender person 10/30/2016  . Female-to-female transgender person 10/30/2016   Sees endocrinology Dr Linward Natal at [facility] in [place]  . Preventative health care 08/13/2014  . Sun-damaged skin 01/13/2014  . Tobacco use disorder 01/13/2014    Past Surgical History:  Procedure Laterality Date  . COLONOSCOPY  2018  . POLYPECTOMY    . TONSILLECTOMY  1954    Family History  Problem Relation Age of Onset  . Colon cancer Father 4  . Hyperlipidemia Father   . Heart disease Father        stent placement  . Hypertension Father   . Cancer Father 67       colon with liver mets  . Diabetes Father   . Breast cancer Sister        BRCA2 positive  . Cancer Sister        twice, breast cancer, BRCA 2 gene mutation  . Hyperlipidemia Mother   . Alzheimer's disease Mother   .  Hypertension Mother   . Diabetes Maternal Grandmother        father  . Cancer Son 55  . Colon cancer Son        Joslyn Hy - not biologically related  . Mental illness Son        bipolar  . Allergies Sister   . Breast cancer Paternal Aunt        2 paternal aunts with breast cancer postmenopausal  . Breast cancer Cousin        3 pat female first cousins with breast cancer  . Breast cancer Other        niece with breast cancer  . Colon cancer Maternal Uncle   . Prostate cancer Neg Hx   . Esophageal cancer Neg Hx   . Rectal cancer Neg Hx   . Stomach cancer Neg Hx     Social History   Socioeconomic History  . Marital status: Married    Spouse name: Not on file  . Number of children: Not on file  . Years of education: Not on file  . Highest education level: Not on file  Occupational History  . Not on file  Tobacco Use  . Smoking status: Former Smoker    Packs/day: 1.00    Years: 45.00  Pack years: 45.00    Types: Cigarettes    Quit date: 05/14/2016    Years since quitting: 4.6  . Smokeless tobacco: Never Used  . Tobacco comment: 1/2  ppd  Vaping Use  . Vaping Use: Never used  Substance and Sexual Activity  . Alcohol use: Yes    Alcohol/week: 12.0 standard drinks    Types: 12 Cans of beer per week    Comment: Drinks weekly  . Drug use: No  . Sexual activity: Not on file  Other Topics Concern  . Not on file  Social History Narrative  . Not on file   Social Determinants of Health   Financial Resource Strain: Low Risk   . Difficulty of Paying Living Expenses: Not hard at all  Food Insecurity: No Food Insecurity  . Worried About Charity fundraiser in the Last Year: Never true  . Ran Out of Food in the Last Year: Never true  Transportation Needs: No Transportation Needs  . Lack of Transportation (Medical): No  . Lack of Transportation (Non-Medical): No  Physical Activity: Not on file  Stress: Not on file  Social Connections: Not on file  Intimate Partner  Violence: Not on file    Outpatient Medications Prior to Visit  Medication Sig Dispense Refill  . aspirin 81 MG chewable tablet Chew 81 mg by mouth daily.    Marland Kitchen atorvastatin (LIPITOR) 20 MG tablet Take 1 tablet (20 mg total) by mouth daily. 90 tablet 1  . Biotin 5000 MCG TABS Take 1 tablet by mouth daily.    . Cholecalciferol (VITAMIN D) 50 MCG (2000 UT) CAPS Take by mouth.    . estradiol (ESTRACE) 1 MG tablet Take 3 tablets (3 mg total) by mouth daily. 270 tablet 3  . Fluocinolone Acetonide 0.01 % OIL PLACE 5 DROPS IN EAR(S) 2 TIMES A DAY 20 mL 1  . KRILL OIL PO Take 1 capsule by mouth daily.    Marland Kitchen lactobacillus acidophilus (BACID) TABS tablet Take 1 tablet by mouth daily.    Marland Kitchen MELATONIN PO Take 3 mg by mouth at bedtime as needed.     . Multiple Vitamin (MULTIVITAMIN) tablet Take 1 tablet by mouth daily.    . psyllium (REGULOID) 0.52 G capsule Take 0.52 g by mouth daily.    Marland Kitchen spironolactone (ALDACTONE) 50 MG tablet TAKE 1 TABLET (50 MG TOTAL) BY MOUTH DAILY. 90 tablet 5  . amLODipine (NORVASC) 5 MG tablet Take 1 tablet (5 mg total) by mouth 2 (two) times daily. 180 tablet 1   No facility-administered medications prior to visit.    Allergies  Allergen Reactions  . Lisinopril Cough    hoarseness    Review of Systems  Constitutional: Negative for chills, fever and malaise/fatigue.  HENT: Negative for congestion and hearing loss.   Eyes: Negative for discharge.  Respiratory: Negative for cough, sputum production and shortness of breath.   Cardiovascular: Negative for chest pain, palpitations and leg swelling.  Gastrointestinal: Negative for abdominal pain, blood in stool, constipation, diarrhea, heartburn, nausea and vomiting.  Genitourinary: Negative for dysuria, frequency, hematuria and urgency.  Musculoskeletal: Negative for back pain, falls and myalgias.  Skin: Negative for rash.  Neurological: Negative for dizziness, sensory change, loss of consciousness, weakness and headaches.   Endo/Heme/Allergies: Negative for environmental allergies. Does not bruise/bleed easily.  Psychiatric/Behavioral: Negative for depression and suicidal ideas. The patient is not nervous/anxious and does not have insomnia.        Objective:  Physical Exam Constitutional:      General: She is not in acute distress.    Appearance: She is well-developed and well-nourished.  HENT:     Head: Normocephalic and atraumatic.  Eyes:     Conjunctiva/sclera: Conjunctivae normal.  Neck:     Thyroid: No thyromegaly.  Cardiovascular:     Rate and Rhythm: Normal rate and regular rhythm.     Heart sounds: Normal heart sounds. No murmur heard.   Pulmonary:     Effort: Pulmonary effort is normal. No respiratory distress.     Breath sounds: Normal breath sounds.  Abdominal:     General: Bowel sounds are normal. There is no distension.     Palpations: Abdomen is soft. There is no mass.     Tenderness: There is no abdominal tenderness.  Musculoskeletal:        General: No edema.     Cervical back: Neck supple.  Lymphadenopathy:     Cervical: No cervical adenopathy.  Skin:    General: Skin is warm and dry.  Neurological:     Mental Status: She is alert and oriented to person, place, and time.  Psychiatric:        Mood and Affect: Mood and affect normal.        Behavior: Behavior normal.     BP 124/82   Pulse 67   Temp 98.3 F (36.8 C)   Resp 16   Ht $R'5\' 8"'vA$  (1.727 m)   Wt 206 lb 9.6 oz (93.7 kg)   SpO2 98%   BMI 31.41 kg/m  Wt Readings from Last 3 Encounters:  01/10/21 206 lb 9.6 oz (93.7 kg)  06/27/20 204 lb (92.5 kg)  06/27/20 204 lb 6.4 oz (92.7 kg)    Diabetic Foot Exam - Simple   No data filed    Lab Results  Component Value Date   WBC 9.3 06/27/2020   HGB 14.5 06/27/2020   HCT 42.2 06/27/2020   PLT 294.0 06/27/2020   GLUCOSE 120 (H) 06/27/2020   CHOL 184 06/27/2020   TRIG 109.0 06/27/2020   HDL 59.80 06/27/2020   LDLCALC 102 (H) 06/27/2020   ALT 25 06/27/2020    AST 17 06/27/2020   NA 137 06/27/2020   K 4.9 06/27/2020   CL 103 06/27/2020   CREATININE 0.85 06/27/2020   BUN 14 06/27/2020   CO2 29 06/27/2020   TSH 3.58 06/27/2020   PSA 0.02 (L) 11/17/2018   HGBA1C 6.3 06/27/2020   MICROALBUR <0.7 08/19/2015    Lab Results  Component Value Date   TSH 3.58 06/27/2020   Lab Results  Component Value Date   WBC 9.3 06/27/2020   HGB 14.5 06/27/2020   HCT 42.2 06/27/2020   MCV 96.2 06/27/2020   PLT 294.0 06/27/2020   Lab Results  Component Value Date   NA 137 06/27/2020   K 4.9 06/27/2020   CO2 29 06/27/2020   GLUCOSE 120 (H) 06/27/2020   BUN 14 06/27/2020   CREATININE 0.85 06/27/2020   BILITOT 0.4 06/27/2020   ALKPHOS 77 06/27/2020   AST 17 06/27/2020   ALT 25 06/27/2020   PROT 6.5 06/27/2020   ALBUMIN 4.3 06/27/2020   CALCIUM 9.6 06/27/2020   GFR 88.95 06/27/2020   Lab Results  Component Value Date   CHOL 184 06/27/2020   Lab Results  Component Value Date   HDL 59.80 06/27/2020   Lab Results  Component Value Date   LDLCALC 102 (H) 06/27/2020   Lab Results  Component Value Date   TRIG 109.0 06/27/2020   Lab Results  Component Value Date   CHOLHDL 3 06/27/2020   Lab Results  Component Value Date   HGBA1C 6.3 06/27/2020       Assessment & Plan:   Problem List Items Addressed This Visit    Hyperlipidemia - Primary   Relevant Orders   Lipid panel   HTN (hypertension)    Well controlled, no changes to meds. Encouraged heart healthy diet such as the DASH diet and exercise as tolerated.       Relevant Orders   CBC   TSH   Diabetes mellitus without complication (HCC)    [id number] acceptable, minimize simple carbs. Increase exercise as tolerated. Continue current meds      Relevant Orders   Hemoglobin A1c   Comprehensive metabolic panel   Preventative health care    Patient encouraged to maintain heart healthy diet, regular exercise, adequate sleep. Consider daily probiotics. Take medications as  prescribed.  Labs reviewed and ordered      Family history of BRCA2 gene positive   History of colonic polyps    Had his last colonoscopy on 11/03/2019, repeat per GI notes 11/02/2022      Increased urinary frequency   Relevant Orders   Microalbumin / creatinine urine ratio   PSA   Female-to-female transgender person    Doing well working with endocrinology         I am having Gladis M. Bonadonna maintain her aspirin, KRILL OIL PO, lactobacillus acidophilus, multivitamin, psyllium, MELATONIN PO, Biotin, estradiol, amLODipine, Vitamin D, Fluocinolone Acetonide, spironolactone, and atorvastatin.  No orders of the defined types were placed in this encounter.    Penni Homans, MD

## 2021-01-10 NOTE — Assessment & Plan Note (Signed)
Had his last colonoscopy on 11/03/2019, repeat per GI notes 11/02/2022

## 2021-01-11 ENCOUNTER — Telehealth: Payer: Self-pay

## 2021-01-11 NOTE — Telephone Encounter (Signed)
Attempted to call to go over lab results

## 2021-01-11 NOTE — Telephone Encounter (Signed)
-----   Message from Mosie Lukes, MD sent at 01/10/2021  7:53 PM EST ----- Notify labs look good, no new concerns, no changes.

## 2021-02-08 ENCOUNTER — Other Ambulatory Visit: Payer: Self-pay

## 2021-02-08 ENCOUNTER — Other Ambulatory Visit: Payer: Self-pay | Admitting: Endocrinology

## 2021-02-08 ENCOUNTER — Ambulatory Visit: Payer: PPO | Admitting: Endocrinology

## 2021-02-08 VITALS — BP 150/90 | HR 79 | Ht 68.5 in | Wt 206.0 lb

## 2021-02-08 DIAGNOSIS — R609 Edema, unspecified: Secondary | ICD-10-CM | POA: Diagnosis not present

## 2021-02-08 DIAGNOSIS — Z789 Other specified health status: Secondary | ICD-10-CM

## 2021-02-08 MED ORDER — ESTRADIOL 1 MG PO TABS: 3.0000 mg | ORAL_TABLET | Freq: Every day | ORAL | 3 refills | Status: DC

## 2021-02-08 NOTE — Patient Instructions (Addendum)
Your blood pressure is high today.  Please see your primary care provider soon, to have it rechecked.   Blood tests are requested for you today.  We'll let you know about the results.   Please come back for a follow-up appointment in 1 year.   

## 2021-02-08 NOTE — Progress Notes (Signed)
Subjective:    Patient ID: Taylor Newman, adult    DOB: [birth year], 70 y.o.   MRN: [id number]  HPI Pt returns for f/u of transgender state (M to F): Surgery: never Medication: estradiol and aldactone since 2017 Counseling: ongoing Other: she quit smoking in 2017; aldactone dosage has been limited by hyperkalemia; she is physically but not legally separated from her wife; she has 1 biological child, and 2 stepchildren; she has had (neg) dopplers for leg edema.   Interval hx: She has breast growth has plateaued.  pt states she feels well in general.   Past Medical History:  Diagnosis Date  . Chest pain 01/12/2013  . Dyspnea 05/20/2017  . FHx: BRCA2 gene positive 02/20/2015   Sister with this and breast cancer.  . Grief reaction 07/15/2013  . Heart murmur   . History of chicken pox   . History of colon polyps   . History of tobacco abuse 01/13/2014   Last cigarettes 05/30/2016  . Hyperkalemia 02/11/2017  . Hyperlipidemia   . Hypertension   . Laceration of hand 10/24/2014  . Female-to-female transgender person 10/30/2016  . Female-to-female transgender person 10/30/2016   Sees endocrinology Dr Linward Natal at [facility] in [place]  . Preventative health care 08/13/2014  . Sun-damaged skin 01/13/2014  . Tobacco use disorder 01/13/2014    Past Surgical History:  Procedure Laterality Date  . COLONOSCOPY  2018  . POLYPECTOMY    . TONSILLECTOMY  1954    Social History   Socioeconomic History  . Marital status: Married    Spouse name: Not on file  . Number of children: Not on file  . Years of education: Not on file  . Highest education level: Not on file  Occupational History  . Not on file  Tobacco Use  . Smoking status: Former Smoker    Packs/day: 1.00    Years: 45.00    Pack years: 45.00    Types: Cigarettes    Quit date: 05/14/2016    Years since quitting: 4.7  . Smokeless tobacco: Never Used  . Tobacco comment: 1/2  ppd  Vaping Use  . Vaping Use: Never  used  Substance and Sexual Activity  . Alcohol use: Yes    Alcohol/week: 12.0 standard drinks    Types: 12 Cans of beer per week    Comment: Drinks weekly  . Drug use: No  . Sexual activity: Not on file  Other Topics Concern  . Not on file  Social History Narrative  . Not on file   Social Determinants of Health   Financial Resource Strain: Low Risk   . Difficulty of Paying Living Expenses: Not hard at all  Food Insecurity: No Food Insecurity  . Worried About Charity fundraiser in the Last Year: Never true  . Ran Out of Food in the Last Year: Never true  Transportation Needs: No Transportation Needs  . Lack of Transportation (Medical): No  . Lack of Transportation (Non-Medical): No  Physical Activity: Not on file  Stress: Not on file  Social Connections: Not on file  Intimate Partner Violence: Not on file    Current Outpatient Medications on File Prior to Visit  Medication Sig Dispense Refill  . atorvastatin (LIPITOR) 20 MG tablet Take 1 tablet (20 mg total) by mouth daily. 90 tablet 1  . Biotin 5000 MCG TABS Take 1 tablet by mouth daily.    . Cholecalciferol (VITAMIN D) 50 MCG (2000 UT) CAPS Take by  mouth.    . Fluocinolone Acetonide 0.01 % OIL PLACE 5 DROPS IN EAR(S) 2 TIMES A DAY 20 mL 1  . KRILL OIL PO Take 1 capsule by mouth daily.    Marland Kitchen lactobacillus acidophilus (BACID) TABS tablet Take 1 tablet by mouth daily.    Marland Kitchen MELATONIN PO Take 3 mg by mouth at bedtime as needed.     . Multiple Vitamin (MULTIVITAMIN) tablet Take 1 tablet by mouth daily.    . psyllium (REGULOID) 0.52 G capsule Take 0.52 g by mouth daily.    Marland Kitchen spironolactone (ALDACTONE) 50 MG tablet TAKE 1 TABLET (50 MG TOTAL) BY MOUTH DAILY. 90 tablet 5  . amLODipine (NORVASC) 5 MG tablet Take 1 tablet (5 mg total) by mouth 2 (two) times daily. 180 tablet 1  . aspirin 81 MG chewable tablet Chew 81 mg by mouth daily.     No current facility-administered medications on file prior to visit.    Allergies   Allergen Reactions  . Lisinopril Cough    hoarseness    Family History  Problem Relation Age of Onset  . Colon cancer Father 60  . Hyperlipidemia Father   . Heart disease Father        stent placement  . Hypertension Father   . Cancer Father 31       colon with liver mets  . Diabetes Father   . Breast cancer Sister        BRCA2 positive  . Cancer Sister        twice, breast cancer, BRCA 2 gene mutation  . Hyperlipidemia Mother   . Alzheimer's disease Mother   . Hypertension Mother   . Diabetes Maternal Grandmother        father  . Cancer Son 29  . Colon cancer Son        Joslyn Hy - not biologically related  . Mental illness Son        bipolar  . Allergies Sister   . Breast cancer Paternal Aunt        2 paternal aunts with breast cancer postmenopausal  . Breast cancer Cousin        3 pat female first cousins with breast cancer  . Breast cancer Other        niece with breast cancer  . Colon cancer Maternal Uncle   . Prostate cancer Neg Hx   . Esophageal cancer Neg Hx   . Rectal cancer Neg Hx   . Stomach cancer Neg Hx     BP (!) 150/90 (BP Location: Right Arm, Patient Position: Sitting, Cuff Size: Large)   Pulse 79   Ht 5' 8.5" (1.74 m)   Wt 206 lb (93.4 kg)   SpO2 98%   BMI 30.87 kg/m     Review of Systems Denies sob    Objective:   Physical Exam VITAL SIGNS:  See vs page GENERAL: no distress EXT: trace left leg edema   Lab Results  Component Value Date   CREATININE 0.79 01/10/2021   BUN 12 01/10/2021   NA 135 01/10/2021   K 4.3 01/10/2021   CL 101 01/10/2021   CO2 29 01/10/2021       Assessment & Plan:  HTN: is noted today Transgender state: uncontrolled.  Please continue the same E2 for now Edema: check d-dimer  Patient Instructions  Your blood pressure is high today.  Please see your primary care provider soon, to have it rechecked.   Blood tests are requested for  you today.  We'll let you know about the results.   Please come back  for a follow-up appointment in 1 year.

## 2021-02-10 LAB — TESTOSTERONE,FREE AND TOTAL
Testosterone, Free: 0.5 pg/mL (ref 0.0–4.2)
Testosterone: <3 ng/dL — ABNORMAL LOW (ref 3–67)

## 2021-02-16 ENCOUNTER — Other Ambulatory Visit: Payer: Self-pay | Admitting: Endocrinology

## 2021-02-16 DIAGNOSIS — R609 Edema, unspecified: Secondary | ICD-10-CM

## 2021-02-16 LAB — ESTRADIOL, FREE
Estradiol, Free: 3.2 pg/mL
Estradiol: 207 pg/mL

## 2021-02-16 LAB — D-DIMER, QUANTITATIVE: D-Dimer, Quant: 1.41 mcg/mL FEU — ABNORMAL HIGH (ref <0.50)

## 2021-02-17 ENCOUNTER — Other Ambulatory Visit (HOSPITAL_COMMUNITY): Payer: Self-pay | Admitting: Endocrinology

## 2021-02-17 ENCOUNTER — Encounter (HOSPITAL_COMMUNITY): Payer: PPO

## 2021-02-17 DIAGNOSIS — R600 Localized edema: Secondary | ICD-10-CM

## 2021-02-22 ENCOUNTER — Ambulatory Visit (HOSPITAL_COMMUNITY)
Admission: RE | Admit: 2021-02-22 | Discharge: 2021-02-22 | Disposition: A | Discharge status: Home / Self Care | Payer: PPO | Source: Ambulatory Visit | Attending: Endocrinology | Admitting: Endocrinology

## 2021-02-22 ENCOUNTER — Other Ambulatory Visit: Payer: Self-pay

## 2021-02-22 ENCOUNTER — Encounter: Payer: Self-pay | Admitting: Endocrinology

## 2021-02-22 ENCOUNTER — Encounter: Payer: Self-pay | Admitting: Family Medicine

## 2021-02-22 DIAGNOSIS — R600 Localized edema: Secondary | ICD-10-CM | POA: Insufficient documentation

## 2021-02-24 ENCOUNTER — Encounter: Payer: Self-pay | Admitting: Endocrinology

## 2021-02-25 ENCOUNTER — Other Ambulatory Visit: Payer: Self-pay | Admitting: Endocrinology

## 2021-02-25 DIAGNOSIS — R7989 Other specified abnormal findings of blood chemistry: Secondary | ICD-10-CM | POA: Insufficient documentation

## 2021-02-28 ENCOUNTER — Other Ambulatory Visit: Payer: Self-pay | Admitting: Endocrinology

## 2021-02-28 DIAGNOSIS — R7989 Other specified abnormal findings of blood chemistry: Secondary | ICD-10-CM

## 2021-03-01 ENCOUNTER — Other Ambulatory Visit: Payer: Self-pay

## 2021-03-01 ENCOUNTER — Other Ambulatory Visit: Payer: Self-pay | Admitting: Family Medicine

## 2021-03-01 ENCOUNTER — Other Ambulatory Visit (INDEPENDENT_AMBULATORY_CARE_PROVIDER_SITE_OTHER): Payer: PPO

## 2021-03-01 DIAGNOSIS — R7989 Other specified abnormal findings of blood chemistry: Secondary | ICD-10-CM | POA: Diagnosis not present

## 2021-03-01 LAB — BASIC METABOLIC PANEL
BUN: 15 mg/dL (ref 6–23)
CO2: 27 mEq/L (ref 19–32)
Calcium: 9.7 mg/dL (ref 8.4–10.5)
Chloride: 100 mEq/L (ref 96–112)
Creatinine, Ser: 0.86 mg/dL (ref 0.40–1.20)
GFR: 68.02 mL/min (ref >60.00)
Glucose, Bld: 137 mg/dL — ABNORMAL HIGH (ref 70–99)
Potassium: 4.8 mEq/L (ref 3.5–5.1)
Sodium: 134 mEq/L — ABNORMAL LOW (ref 135–145)

## 2021-03-02 ENCOUNTER — Other Ambulatory Visit: Payer: Self-pay | Admitting: *Deleted

## 2021-03-02 DIAGNOSIS — R609 Edema, unspecified: Secondary | ICD-10-CM

## 2021-03-02 DIAGNOSIS — I999 Unspecified disorder of circulatory system: Secondary | ICD-10-CM

## 2021-03-06 ENCOUNTER — Telehealth: Payer: Self-pay | Admitting: *Deleted

## 2021-03-06 NOTE — Telephone Encounter (Signed)
Patient wanted to let you know she got her appt for vein and vascular and CT scheduled for tomorrow.

## 2021-03-07 ENCOUNTER — Other Ambulatory Visit: Payer: Self-pay

## 2021-03-07 ENCOUNTER — Encounter (HOSPITAL_BASED_OUTPATIENT_CLINIC_OR_DEPARTMENT_OTHER): Payer: Self-pay

## 2021-03-07 ENCOUNTER — Ambulatory Visit (HOSPITAL_BASED_OUTPATIENT_CLINIC_OR_DEPARTMENT_OTHER)
Admission: RE | Admit: 2021-03-07 | Discharge: 2021-03-07 | Disposition: A | Discharge status: Home / Self Care | Payer: PPO | Source: Ambulatory Visit | Attending: Endocrinology | Admitting: Endocrinology

## 2021-03-07 DIAGNOSIS — R7989 Other specified abnormal findings of blood chemistry: Secondary | ICD-10-CM | POA: Diagnosis present

## 2021-03-07 MED ORDER — IOHEXOL 350 MG/ML SOLN
100.0000 mL | Freq: Once | INTRAVENOUS | Status: AC | PRN
  Administered 2021-03-07: 100 mL via INTRAVENOUS

## 2021-03-08 ENCOUNTER — Other Ambulatory Visit: Payer: Self-pay | Admitting: Family Medicine

## 2021-03-08 ENCOUNTER — Telehealth: Payer: Self-pay

## 2021-03-08 ENCOUNTER — Telehealth: Payer: Self-pay | Admitting: *Deleted

## 2021-03-08 DIAGNOSIS — I10 Essential (primary) hypertension: Secondary | ICD-10-CM

## 2021-03-08 MED ORDER — LOSARTAN POTASSIUM 25 MG PO TABS: 25.0000 mg | ORAL_TABLET | Freq: Every day | ORAL | 3 refills | Status: DC

## 2021-03-08 NOTE — Telephone Encounter (Signed)
I reviewed the CT scan. The amlodipine could be contributing to the swelling. We could drop the Amlodipine to once a day and add Losartan 25 mg daily, disp #30 with 3 rf and then do a BP check with nursing in 2-3 weeks and a CMP. Even though he had a cough on lisinopril it is unlikely she will have this side effect on the new medicine as they are different enough

## 2021-03-08 NOTE — Telephone Encounter (Signed)
Patient stated that she had her CT scan if you want to look and her venous study is on 03/28/21.  She also wanted to let you know that she think all this of what is going on, swollen ankles, etc is medication related, maybe the amlodipine.

## 2021-03-08 NOTE — Telephone Encounter (Signed)
Sent message on mychart and medication was sent in

## 2021-03-08 NOTE — Telephone Encounter (Signed)
Medication put in

## 2021-03-11 ENCOUNTER — Other Ambulatory Visit: Payer: Self-pay

## 2021-03-11 DIAGNOSIS — R600 Localized edema: Secondary | ICD-10-CM

## 2021-03-21 ENCOUNTER — Telehealth: Payer: Self-pay | Admitting: *Deleted

## 2021-03-21 NOTE — Telephone Encounter (Signed)
TY

## 2021-03-21 NOTE — Telephone Encounter (Signed)
Patient wanted to let you know that since the medication change that her swelling is less and that she is still going to appointment on 03/28/21.

## 2021-03-28 ENCOUNTER — Other Ambulatory Visit: Payer: Self-pay

## 2021-03-28 ENCOUNTER — Encounter: Payer: Self-pay | Admitting: Vascular Surgery

## 2021-03-28 ENCOUNTER — Ambulatory Visit (HOSPITAL_COMMUNITY)
Admission: RE | Admit: 2021-03-28 | Discharge: 2021-03-28 | Disposition: A | Discharge status: Home / Self Care | Payer: PPO | Source: Ambulatory Visit | Attending: Vascular Surgery | Admitting: Vascular Surgery

## 2021-03-28 ENCOUNTER — Ambulatory Visit: Payer: PPO | Admitting: Vascular Surgery

## 2021-03-28 VITALS — BP 127/86 | HR 98 | Temp 96.8°F | Ht 69.0 in | Wt 206.3 lb

## 2021-03-28 DIAGNOSIS — R600 Localized edema: Secondary | ICD-10-CM | POA: Insufficient documentation

## 2021-03-28 DIAGNOSIS — R609 Edema, unspecified: Secondary | ICD-10-CM | POA: Diagnosis not present

## 2021-03-28 NOTE — Progress Notes (Signed)
[facility] [place]  ASSESSMENT / PLAN: 70 y.o. transgender female with peripheral swelling, with ultrasound evidence of superficial and deep venous reflux.  Swelling has significantly improved with reduction in amlodipine dosing.  I counseled Taylor Newman to keep her legs compressed if she develops symptomatic swelling again.  She should return to care should the swelling become bothersome enough to her to consider intervention.  She is not at this point yet.  Follow-up with me as needed.  CHIEF COMPLAINT: Swelling  HISTORY OF PRESENT ILLNESS: Taylor Newman is a 70 y.o. transgender female referred to clinic for evaluation of left foot and ankle swelling.  She reports that swelling is confined to the left leg and does not seem related to any activity.  Recently her primary care physician adjusted her amlodipine dose, which nearly resolved the swelling.  She presents today for duplex ultrasound evaluation and discussion with me.  She is not bothered by the swelling.  She has no venous ulceration.  She does have some varicosities on the medial thigh particularly the right which are not bothersome to her.  Past Medical History:  Diagnosis Date  . Chest pain 01/12/2013  . Dyspnea 05/20/2017  . FHx: BRCA2 gene positive 02/20/2015   Sister with this and breast cancer.  . Grief reaction 07/15/2013  . Heart murmur   . History of chicken pox   . History of colon polyps   . History of tobacco abuse 01/13/2014   Last cigarettes 05/30/2016  . Hyperkalemia 02/11/2017  . Hyperlipidemia   . Hypertension   . Laceration of hand 10/24/2014  . Female-to-female transgender person 10/30/2016  . Female-to-female transgender person 10/30/2016   Sees endocrinology Dr Linward Natal at [facility] in [place]  . Preventative health care 08/13/2014  . Sun-damaged skin 01/13/2014  . Tobacco use disorder 01/13/2014    Past Surgical History:  Procedure Laterality Date  . COLONOSCOPY   2018  . POLYPECTOMY    . TONSILLECTOMY  1954    Family History  Problem Relation Age of Onset  . Colon cancer Father 81  . Hyperlipidemia Father   . Heart disease Father        stent placement  . Hypertension Father   . Cancer Father 26       colon with liver mets  . Diabetes Father   . Breast cancer Sister        BRCA2 positive  . Cancer Sister        twice, breast cancer, BRCA 2 gene mutation  . Hyperlipidemia Mother   . Alzheimer's disease Mother   . Hypertension Mother   . Diabetes Maternal Grandmother        father  . Cancer Son 45  . Colon cancer Son        Joslyn Hy - not biologically related  . Mental illness Son        bipolar  . Allergies Sister   . Breast cancer Paternal Aunt        2 paternal aunts with breast cancer postmenopausal  . Breast cancer Cousin        3 pat female first cousins with breast cancer  . Breast cancer Other        niece with breast cancer  . Colon cancer Maternal Uncle   . Prostate cancer Neg Hx   . Esophageal cancer Neg Hx   . Rectal cancer Neg Hx   . Stomach cancer Neg Hx  Social History   Socioeconomic History  . Marital status: Married    Spouse name: Not on file  . Number of children: Not on file  . Years of education: Not on file  . Highest education level: Not on file  Occupational History  . Not on file  Tobacco Use  . Smoking status: Former Smoker    Packs/day: 1.00    Years: 45.00    Pack years: 45.00    Types: Cigarettes    Quit date: 05/14/2016    Years since quitting: 4.8  . Smokeless tobacco: Never Used  . Tobacco comment: 1/2  ppd  Vaping Use  . Vaping Use: Never used  Substance and Sexual Activity  . Alcohol use: Yes    Alcohol/week: 12.0 standard drinks    Types: 12 Cans of beer per week    Comment: Drinks weekly  . Drug use: No  . Sexual activity: Not on file  Other Topics Concern  . Not on file  Social History Narrative  . Not on file   Social Determinants of Health   Financial Resource  Strain: Low Risk   . Difficulty of Paying Living Expenses: Not hard at all  Food Insecurity: No Food Insecurity  . Worried About Charity fundraiser in the Last Year: Never true  . Ran Out of Food in the Last Year: Never true  Transportation Needs: No Transportation Needs  . Lack of Transportation (Medical): No  . Lack of Transportation (Non-Medical): No  Physical Activity: Not on file  Stress: Not on file  Social Connections: Not on file  Intimate Partner Violence: Not on file    Allergies  Allergen Reactions  . Lisinopril Cough    hoarseness    Current Outpatient Medications  Medication Sig Dispense Refill  . amLODipine (NORVASC) 5 MG tablet TAKE 1 TABLET (5 MG TOTAL) BY MOUTH 2 (TWO) TIMES DAILY. 180 tablet 1  . aspirin 81 MG chewable tablet Chew 81 mg by mouth daily.    Marland Kitchen atorvastatin (LIPITOR) 20 MG tablet TAKE 1 TABLET BY MOUTH ONCE DAILY 90 tablet 1  . Biotin 5000 MCG TABS Take 1 tablet by mouth daily.    . Cholecalciferol (VITAMIN D) 50 MCG (2000 UT) CAPS Take by mouth.    Marland Kitchen COVID-19 mRNA vaccine, Pfizer, 30 MCG/0.3ML injection AS DIRECTED .3 mL 0  . estradiol (ESTRACE) 1 MG tablet TAKE 3 TABLETS (3 MG TOTAL) BY MOUTH DAILY. 270 tablet 3  . Fluocinolone Acetonide 0.01 % OIL PLACE 5 DROPS IN EAR(S) 2 TIMES A DAY 20 mL 1  . KRILL OIL PO Take 1 capsule by mouth daily.    Marland Kitchen lactobacillus acidophilus (BACID) TABS tablet Take 1 tablet by mouth daily.    Marland Kitchen losartan (COZAAR) 25 MG tablet TAKE 1 TABLET (25 MG TOTAL) BY MOUTH DAILY. 30 tablet 3  . MELATONIN PO Take 3 mg by mouth at bedtime as needed.     . Multiple Vitamin (MULTIVITAMIN) tablet Take 1 tablet by mouth daily.    . psyllium (REGULOID) 0.52 G capsule Take 0.52 g by mouth daily.    Marland Kitchen spironolactone (ALDACTONE) 50 MG tablet TAKE 1 TABLET BY MOUTH ONCE DAILY 90 tablet 5  . Zoster Vaccine Adjuvanted [facility]) injection TO BE ADMINISTERED BY PHARMACIST 1 each 1   No current facility-administered medications for this  visit.    REVIEW OF SYSTEMS:  $RemoveB'[X]'rfAUsmdc$  denotes positive finding, $RemoveBeforeDEI'[ ]'vwPRlesriPCZbtIa$  denotes negative finding Cardiac  Comments:  Chest pain or chest  pressure:    Shortness of breath upon exertion:    Short of breath when lying flat:    Irregular heart rhythm:        Vascular    Pain in calf, thigh, or hip brought on by ambulation:    Pain in feet at night that wakes you up from your sleep:     Blood clot in your veins:    Leg swelling:         Pulmonary    Oxygen at home:    Productive cough:     Wheezing:         Neurologic    Sudden weakness in arms or legs:     Sudden numbness in arms or legs:     Sudden onset of difficulty speaking or slurred speech:    Temporary loss of vision in one eye:     Problems with dizziness:         Gastrointestinal    Blood in stool:     Vomited blood:         Genitourinary    Burning when urinating:     Blood in urine:        Psychiatric    Major depression:         Hematologic    Bleeding problems:    Problems with blood clotting too easily:        Skin    Rashes or ulcers:        Constitutional    Fever or chills:      PHYSICAL EXAM  Vitals:   03/28/21 0856  BP: 127/86  Pulse: 98  Temp: (!) 96.8 F (36 C)  TempSrc: Skin  SpO2: 98%  Weight: 206 lb 4.8 oz (93.6 kg)  Height: $Remove'5\' 9"'pChEgwf$  (1.753 m)   Constitutional: well appearing. no distress. Appears well nourished.  Neurologic: CN intact. no focal findings. no sensory loss. Psychiatric: Mood and affect symmetric and appropriate. Eyes: No icterus. No conjunctival pallor. Ears, nose, throat: mucous membranes moist. Midline trachea.  Cardiac: regular rate and rhythm.  Respiratory: unlabored. Abdominal: soft, non-tender, non-distended.  Peripheral vascular:  Warm and well perfused  Medial right thigh and calf venous varicosities  Reticular veins about BLE Extremity: No edema. No cyanosis. No pallor.  Skin: No gangrene. No ulceration.  Lymphatic: No Stemmer's sign. No palpable  lymphadenopathy.  PERTINENT LABORATORY AND RADIOLOGIC DATA  Most recent CBC CBC Latest Ref Rng & Units 01/10/2021 06/27/2020 12/14/2019  WBC 4.0 - 10.5 K/uL 9.4 9.3 10.1  Hemoglobin 12.0 - 15.0 g/dL 14.3 14.5 14.9  Hematocrit 36.0 - 46.0 % 41.6 42.2 43.8  Platelets 150.0 - 400.0 K/uL 297.0 294.0 299.0     Most recent CMP CMP Latest Ref Rng & Units 03/01/2021 01/10/2021 06/27/2020  Glucose 70 - 99 mg/dL 137(H) 111(H) 120(H)  BUN 6 - 23 mg/dL $Remove'15 12 14  'mlYuRjQ$ Creatinine 0.40 - 1.20 mg/dL 0.86 0.79 0.85  Sodium 135 - 145 mEq/L 134(L) 135 137  Potassium 3.5 - 5.1 mEq/L 4.8 4.3 4.9  Chloride 96 - 112 mEq/L 100 101 103  CO2 19 - 32 mEq/L $Remove'27 29 29  'BpKOzDZ$ Calcium 8.4 - 10.5 mg/dL 9.7 9.3 9.6  Total Protein 6.0 - 8.3 g/dL - 6.4 6.5  Total Bilirubin 0.2 - 1.2 mg/dL - 0.7 0.4  Alkaline Phos 39 - 117 U/L - 62 77  AST 0 - 37 U/L - 22 17  ALT 0 - 35 U/L - 26 25  Renal function CrCl cannot be calculated (Patient's most recent lab result is older than the maximum 21 days allowed.).  Hgb A1c MFr Bld (%)  Date Value  01/10/2021 6.6 (H)    LDL Cholesterol  Date Value Ref Range Status  01/10/2021 87 0 - 99 mg/dL Final     Vascular Imaging: Venous duplex 03/28/21 Bilateral greater saphenous vein reflux Bilateral common femoral vein reflux  Yevonne Aline. Stanford Breed, MD [facility] Phone Number: [phone] 03/28/2021 8:20 AM

## 2021-04-03 ENCOUNTER — Other Ambulatory Visit (HOSPITAL_BASED_OUTPATIENT_CLINIC_OR_DEPARTMENT_OTHER): Payer: Self-pay

## 2021-04-03 MED FILL — Losartan Potassium Tab 25 MG: ORAL | 30 days supply | Qty: 30 | Fill #0 | Status: AC

## 2021-04-05 ENCOUNTER — Ambulatory Visit: Payer: PPO | Attending: Internal Medicine

## 2021-04-05 DIAGNOSIS — Z23 Encounter for immunization: Secondary | ICD-10-CM

## 2021-04-05 NOTE — Progress Notes (Signed)
   Covid-19 Vaccination Clinic  Name:  Taylor Newman    MRN: [id number] DOB: [birth year]  04/05/2021  [name] was observed post Covid-19 immunization for 15 minutes without incident. She was provided with Vaccine Information Sheet and instruction to access the V-Safe system.   Ms. Corona was instructed to call 911 with any severe reactions post vaccine: Marland Kitchen Difficulty breathing  . Swelling of face and throat  . A fast heartbeat  . A bad rash all over body  . Dizziness and weakness   Immunizations Administered    Name Date Dose VIS Date Route   PFIZER Comrnaty(Gray TOP) Covid-19 Vaccine 04/05/2021 11:55 AM 0.3 mL 11/17/2020 Intramuscular   Manufacturer: [employer]   Lot: [id number]   NDC: [id number]

## 2021-04-07 ENCOUNTER — Other Ambulatory Visit (HOSPITAL_BASED_OUTPATIENT_CLINIC_OR_DEPARTMENT_OTHER): Payer: Self-pay

## 2021-04-07 MED ORDER — PFIZER-BIONT COVID-19 VAC-TRIS 30 MCG/0.3ML IM SUSP
INTRAMUSCULAR | 0 refills | Status: AC
  Filled 2021-04-07: qty 0.3, 1d supply, fill #0

## 2021-04-19 ENCOUNTER — Other Ambulatory Visit (HOSPITAL_BASED_OUTPATIENT_CLINIC_OR_DEPARTMENT_OTHER): Payer: Self-pay

## 2021-05-03 ENCOUNTER — Other Ambulatory Visit (HOSPITAL_BASED_OUTPATIENT_CLINIC_OR_DEPARTMENT_OTHER): Payer: Self-pay

## 2021-05-03 MED FILL — Losartan Potassium Tab 25 MG: ORAL | 30 days supply | Qty: 30 | Fill #1 | Status: AC

## 2021-05-14 MED FILL — Estradiol Tab 1 MG: ORAL | 90 days supply | Qty: 270 | Fill #0 | Status: AC

## 2021-05-15 ENCOUNTER — Other Ambulatory Visit (HOSPITAL_BASED_OUTPATIENT_CLINIC_OR_DEPARTMENT_OTHER): Payer: Self-pay

## 2021-05-31 ENCOUNTER — Other Ambulatory Visit (HOSPITAL_BASED_OUTPATIENT_CLINIC_OR_DEPARTMENT_OTHER): Payer: Self-pay

## 2021-05-31 ENCOUNTER — Encounter: Payer: Self-pay | Admitting: Family Medicine

## 2021-05-31 ENCOUNTER — Other Ambulatory Visit: Payer: Self-pay | Admitting: Family Medicine

## 2021-05-31 DIAGNOSIS — E785 Hyperlipidemia, unspecified: Secondary | ICD-10-CM

## 2021-05-31 DIAGNOSIS — I10 Essential (primary) hypertension: Secondary | ICD-10-CM

## 2021-05-31 MED ORDER — LOSARTAN POTASSIUM 25 MG PO TABS
25.0000 mg | ORAL_TABLET | Freq: Every day | ORAL | 1 refills | Status: AC
  Filled 2021-05-31: qty 90, 90d supply, fill #0

## 2021-05-31 MED ORDER — ATORVASTATIN CALCIUM 20 MG PO TABS
1.0000 | ORAL_TABLET | Freq: Every day | ORAL | 1 refills | Status: AC
  Filled 2021-05-31: qty 90, 90d supply, fill #0

## 2021-05-31 MED FILL — Spironolactone Tab 50 MG: ORAL | 90 days supply | Qty: 90 | Fill #0 | Status: AC

## 2021-06-02 ENCOUNTER — Other Ambulatory Visit (HOSPITAL_BASED_OUTPATIENT_CLINIC_OR_DEPARTMENT_OTHER): Payer: Self-pay

## 2021-07-05 ENCOUNTER — Telehealth: Payer: Self-pay | Admitting: Family Medicine

## 2021-07-05 NOTE — Telephone Encounter (Signed)
Copied from [place] [phone]. Topic: Medicare AWV >> Jul 05, 2021  1:50 PM Harris-Coley, Hannah Beat wrote: Reason for CRM: Left message for patient to schedule Annual Wellness Visit.  Please schedule with Health Nurse Advisor Augustine Radar. at [facility].

## 2021-07-10 ENCOUNTER — Ambulatory Visit: Payer: PPO | Admitting: Family Medicine

## 2021-07-24 ENCOUNTER — Other Ambulatory Visit: Payer: Self-pay | Admitting: Family Medicine

## 2021-07-24 ENCOUNTER — Other Ambulatory Visit (HOSPITAL_BASED_OUTPATIENT_CLINIC_OR_DEPARTMENT_OTHER): Payer: Self-pay

## 2021-07-24 MED ORDER — FLUOCINOLONE ACETONIDE 0.01 % OT OIL
TOPICAL_OIL | OTIC | 1 refills | Status: AC
  Filled 2021-07-24: qty 20, 20d supply, fill #0

## 2021-07-25 ENCOUNTER — Other Ambulatory Visit (HOSPITAL_BASED_OUTPATIENT_CLINIC_OR_DEPARTMENT_OTHER): Payer: Self-pay

## 2021-08-22 ENCOUNTER — Encounter: Payer: Self-pay | Admitting: Family Medicine

## 2021-08-22 ENCOUNTER — Other Ambulatory Visit: Payer: Self-pay | Admitting: Endocrinology

## 2021-08-22 ENCOUNTER — Other Ambulatory Visit (HOSPITAL_BASED_OUTPATIENT_CLINIC_OR_DEPARTMENT_OTHER): Payer: Self-pay

## 2021-08-22 ENCOUNTER — Encounter: Payer: Self-pay | Admitting: Endocrinology

## 2021-08-22 MED ORDER — ESTRADIOL 1 MG PO TABS: ORAL_TABLET | ORAL | 0 refills | Status: AC

## 2021-08-22 MED ORDER — SPIRONOLACTONE 50 MG PO TABS
ORAL_TABLET | Freq: Every day | ORAL | 3 refills | Status: DC
  Filled 2021-08-22: qty 90, fill #0

## 2021-08-22 MED ORDER — SPIRONOLACTONE 50 MG PO TABS: ORAL_TABLET | Freq: Every day | ORAL | 0 refills | Status: AC

## 2021-08-22 NOTE — Telephone Encounter (Signed)
Spoke with pt and he requested that his paper Rx be mailed out to him in . I have placed the Rx out to be mailed.

## 2021-08-23 ENCOUNTER — Telehealth: Payer: Self-pay

## 2021-08-23 NOTE — Telephone Encounter (Signed)
Called to see if she would like her medication sent to the pharmacy or mailed.

## 2021-08-23 NOTE — Telephone Encounter (Signed)
Lvm to see if she would like her medication sent to the pharmacy or mailed.

## 2021-08-24 ENCOUNTER — Encounter: Payer: Self-pay | Admitting: Family Medicine

## 2021-08-28 ENCOUNTER — Other Ambulatory Visit (HOSPITAL_BASED_OUTPATIENT_CLINIC_OR_DEPARTMENT_OTHER): Payer: Self-pay

## 2022-01-11 ENCOUNTER — Encounter: Payer: PPO | Admitting: Family Medicine

## 2022-02-05 ENCOUNTER — Telehealth: Payer: Self-pay

## 2022-02-05 NOTE — Telephone Encounter (Signed)
Pt stated that he has a new Endocrinologist in [place] so he needs to cancel his appt on 02/08/2022

## 2022-02-08 ENCOUNTER — Ambulatory Visit: Payer: Medicare Other | Admitting: Endocrinology

## 2022-10-02 ENCOUNTER — Encounter: Payer: Self-pay | Admitting: Internal Medicine

## 2022-11-26 IMAGING — CT CT ANGIO CHEST
2 of 8 series · 19 of 36 positions shown · IV contrast (Omnipaque)
Comparison: None.

CLINICAL DATA: Left foot swelling for a month.  Elevated D-dimer.

EXAM:
CT ANGIOGRAPHY CHEST WITH CONTRAST
TECHNIQUE: Multidetector CT imaging of the chest was performed using the
standard protocol during bolus administration of intravenous
contrast. Multiplanar CT image reconstructions and MIPs were
obtained to evaluate the vascular anatomy.
CONTRAST:  100mL OMNIPAQUE IOHEXOL 350 MG/ML SOLN

[Series 6: pe thins · axial · 0.73mm/px · z∈[-278,-21]mm · 18 of 289 slices shown]
[im 16/289  lung]
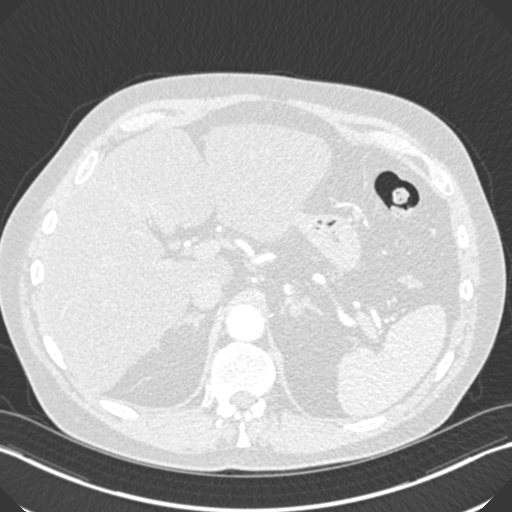
[im 31/289  mediastinal]
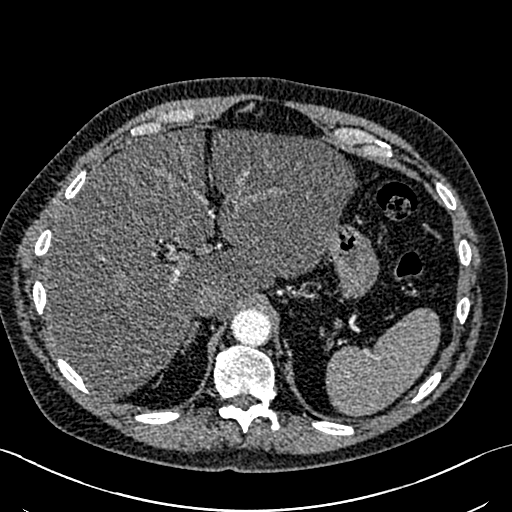
[im 46/289  lung]
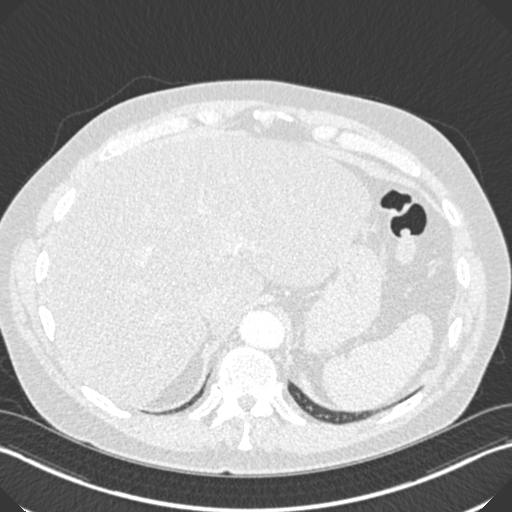
[im 61/289  mediastinal]
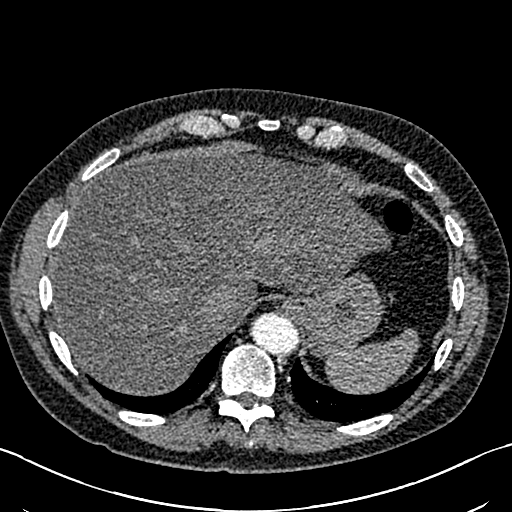
[im 76/289  lung]
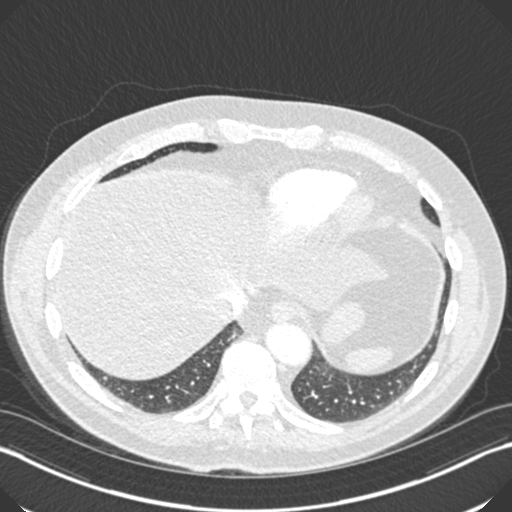
[im 91/289  mediastinal]
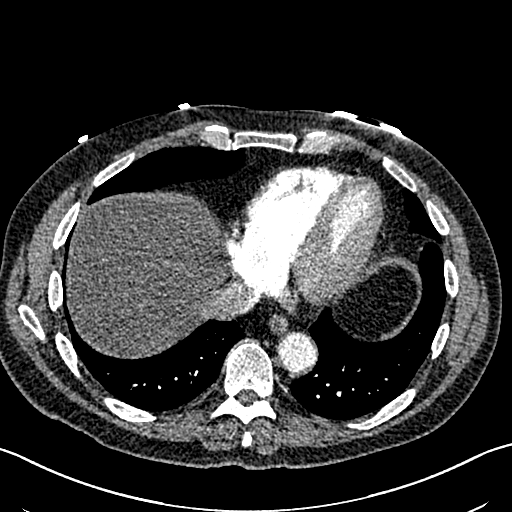
[im 107/289  lung]
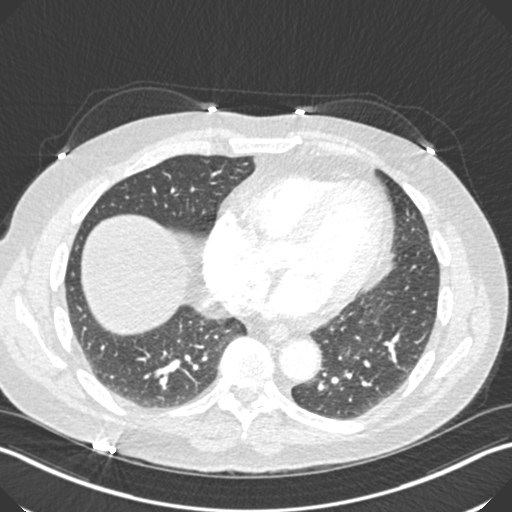
[im 122/289  mediastinal]
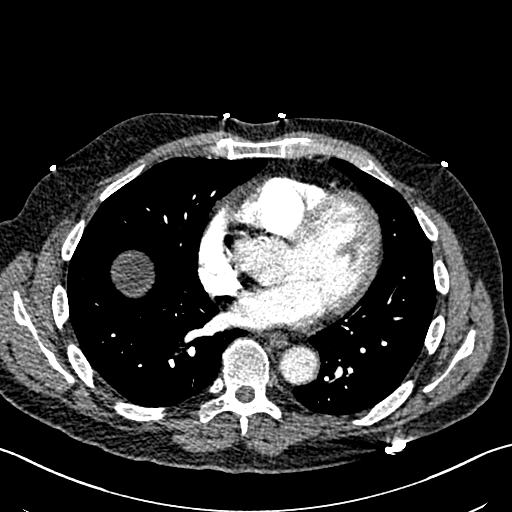
[im 137/289  lung]
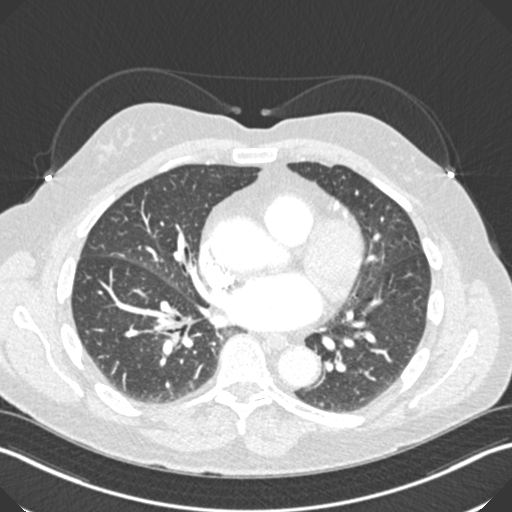
[im 152/289  mediastinal]
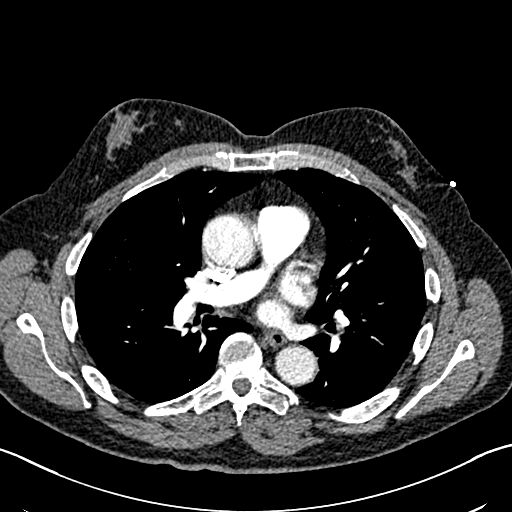
[im 167/289  lung]
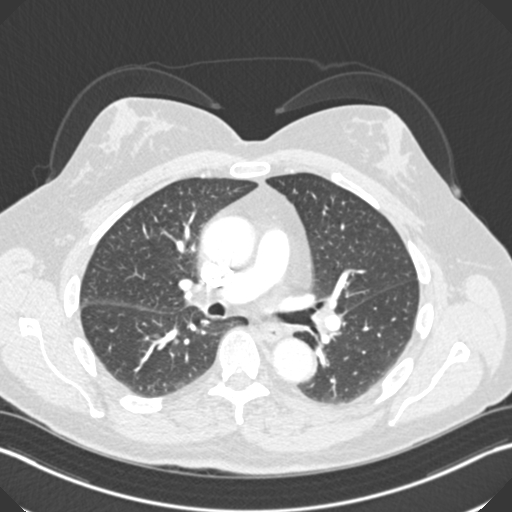
[im 182/289  mediastinal]
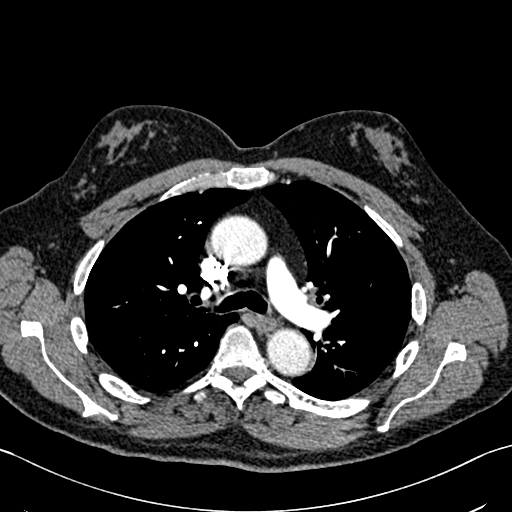
[im 198/289  lung]
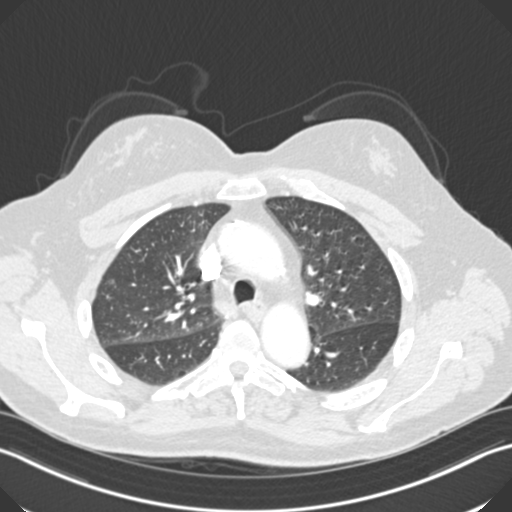
[im 213/289  mediastinal]
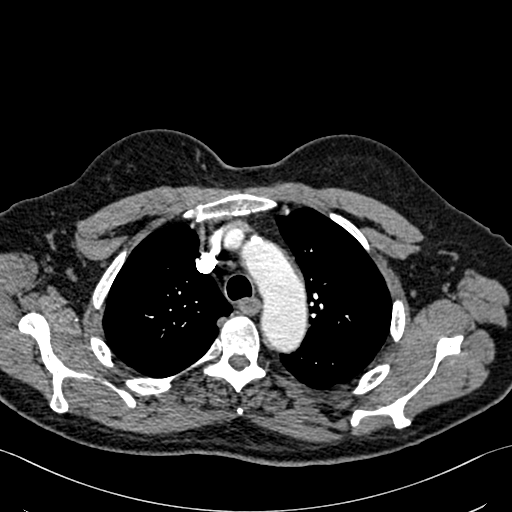
[im 228/289  lung]
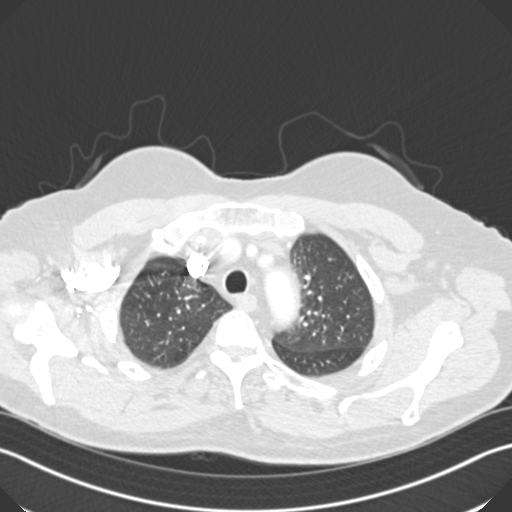
[im 243/289  mediastinal]
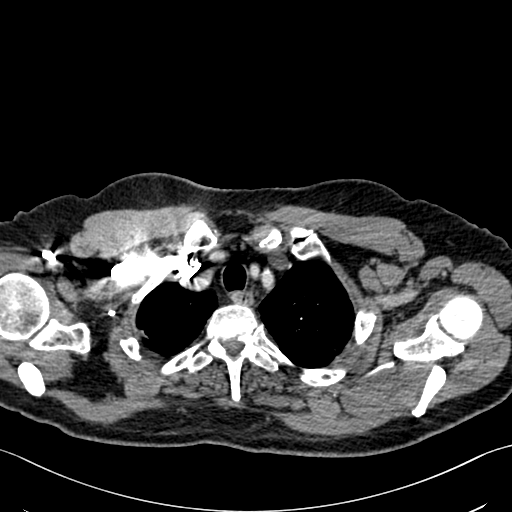
[im 258/289  lung]
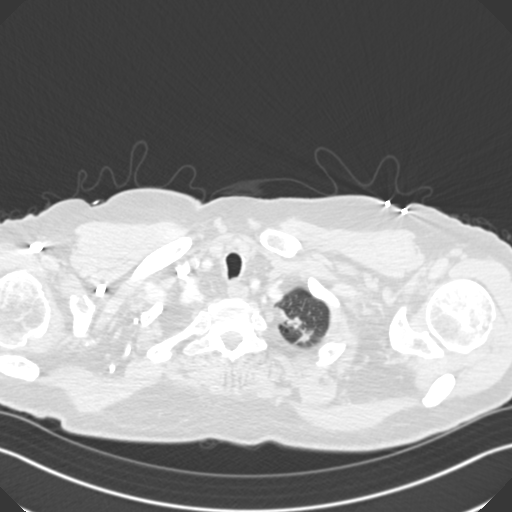
[im 273/289  mediastinal]
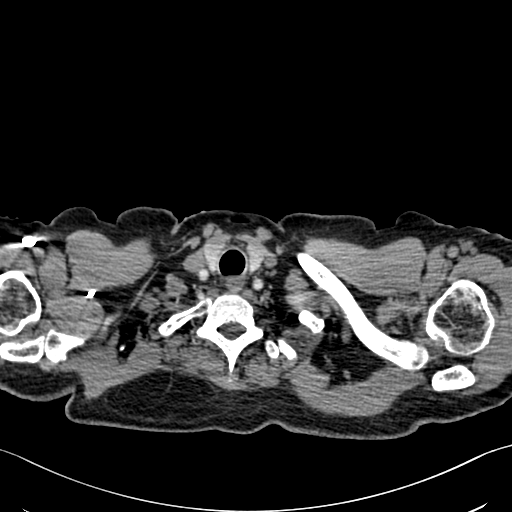

[Series 7: pe coronal mpr · coronal · 0.61mm/px · 1 of 129 slices shown]
[im 65/129  mediastinal]
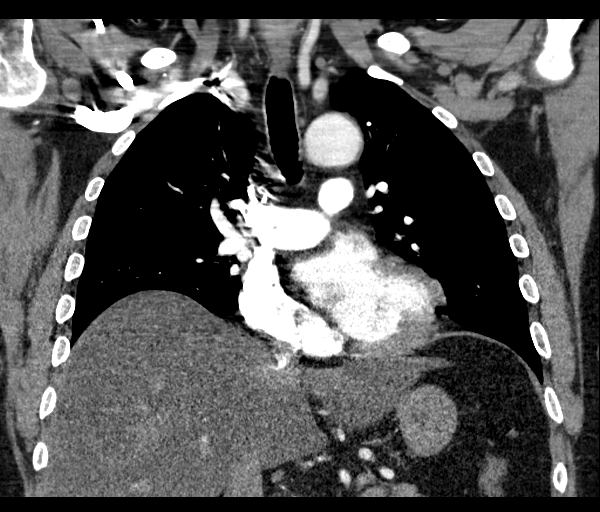

[19 of 36 positions shown; findings below may reference images not displayed]

FINDINGS: Cardiovascular: Satisfactory opacification of the pulmonary arteries
to the segmental level. No evidence of pulmonary embolism. Normal
heart size. No pericardial effusion. Coronary artery
atherosclerosis.

Mediastinum/Nodes: No enlarged mediastinal, hilar, or axillary lymph
nodes. Thyroid gland, trachea, and esophagus demonstrate no
significant findings.

Lungs/Pleura: Lungs are clear. No pleural effusion or pneumothorax.

Upper Abdomen: No acute abnormality. Low-attenuation of the liver as
can be seen with hepatic steatosis.

Musculoskeletal: No chest wall abnormality. No acute or significant
osseous findings.

Review of the MIP images confirms the above findings.
IMPRESSION: 1. No evidence of pulmonary embolus.
2. No acute cardiopulmonary disease.
3. Hepatic steatosis.
